# Patient Record
Sex: Male | Born: 1960 | Race: White | Hispanic: No | Marital: Married | Smoking: Never smoker
Health system: Southern US, Community
[De-identification: ages and names within clinical notes are randomized; demographics above are authoritative.]

## PROBLEM LIST (undated history)

## (undated) DIAGNOSIS — R7989 Other specified abnormal findings of blood chemistry: Secondary | ICD-10-CM

## (undated) DIAGNOSIS — E785 Hyperlipidemia, unspecified: Secondary | ICD-10-CM

## (undated) DIAGNOSIS — M549 Dorsalgia, unspecified: Secondary | ICD-10-CM

## (undated) DIAGNOSIS — R790 Abnormal level of blood mineral: Secondary | ICD-10-CM

## (undated) DIAGNOSIS — S42301A Unspecified fracture of shaft of humerus, right arm, initial encounter for closed fracture: Secondary | ICD-10-CM

## (undated) DIAGNOSIS — I10 Essential (primary) hypertension: Secondary | ICD-10-CM

## (undated) DIAGNOSIS — Z8489 Family history of other specified conditions: Secondary | ICD-10-CM

## (undated) DIAGNOSIS — J309 Allergic rhinitis, unspecified: Secondary | ICD-10-CM

## (undated) DIAGNOSIS — R300 Dysuria: Secondary | ICD-10-CM

## (undated) DIAGNOSIS — K2980 Duodenitis without bleeding: Secondary | ICD-10-CM

## (undated) DIAGNOSIS — K2970 Gastritis, unspecified, without bleeding: Secondary | ICD-10-CM

## (undated) DIAGNOSIS — N200 Calculus of kidney: Secondary | ICD-10-CM

## (undated) DIAGNOSIS — M1990 Unspecified osteoarthritis, unspecified site: Secondary | ICD-10-CM

## (undated) HISTORY — DX: Dorsalgia, unspecified: M54.9

## (undated) HISTORY — DX: Dysuria: R30.0

## (undated) HISTORY — DX: Duodenitis without bleeding: K29.80

## (undated) HISTORY — DX: Hypercalcemia: E83.52

## (undated) HISTORY — DX: Other specified abnormal findings of blood chemistry: R79.89

## (undated) HISTORY — DX: Unspecified osteoarthritis, unspecified site: M19.90

## (undated) HISTORY — DX: Unspecified fracture of shaft of humerus, right arm, initial encounter for closed fracture: S42.301A

## (undated) HISTORY — DX: Allergic rhinitis, unspecified: J30.9

## (undated) HISTORY — DX: Gastritis, unspecified, without bleeding: K29.70

## (undated) HISTORY — DX: Calculus of kidney: N20.0

## (undated) HISTORY — DX: Hyperlipidemia, unspecified: E78.5

## (undated) HISTORY — PX: NASAL SINUS SURGERY: SHX719

## (undated) HISTORY — DX: Abnormal level of blood mineral: R79.0

## (undated) HISTORY — PX: TONSILLECTOMY: SUR1361

---

## 1998-10-10 HISTORY — PX: ESOPHAGOGASTRODUODENOSCOPY: SHX1529

## 2002-02-16 HISTORY — PX: FLEXIBLE SIGMOIDOSCOPY: SHX1649

## 2004-03-12 ENCOUNTER — Other Ambulatory Visit: Payer: Self-pay

## 2005-06-15 ENCOUNTER — Emergency Department: Payer: Self-pay | Admitting: Internal Medicine

## 2008-07-07 ENCOUNTER — Ambulatory Visit: Payer: Self-pay | Admitting: Family Medicine

## 2008-09-17 ENCOUNTER — Ambulatory Visit: Payer: Self-pay | Admitting: Internal Medicine

## 2008-10-14 ENCOUNTER — Ambulatory Visit: Payer: Self-pay | Admitting: Internal Medicine

## 2009-02-28 ENCOUNTER — Ambulatory Visit: Payer: Self-pay | Admitting: Gastroenterology

## 2009-02-28 HISTORY — PX: COLONOSCOPY W/ POLYPECTOMY: SHX1380

## 2009-04-11 ENCOUNTER — Ambulatory Visit: Payer: Self-pay | Admitting: Gastroenterology

## 2009-04-17 ENCOUNTER — Ambulatory Visit: Payer: Self-pay | Admitting: Surgery

## 2009-04-17 HISTORY — PX: HEMORRHOID SURGERY: SHX153

## 2010-08-14 ENCOUNTER — Ambulatory Visit: Payer: Self-pay | Admitting: Internal Medicine

## 2014-04-11 ENCOUNTER — Ambulatory Visit: Payer: Self-pay | Admitting: Otolaryngology

## 2014-08-29 DIAGNOSIS — R7989 Other specified abnormal findings of blood chemistry: Secondary | ICD-10-CM | POA: Insufficient documentation

## 2015-02-24 ENCOUNTER — Ambulatory Visit: Payer: Self-pay | Admitting: Internal Medicine

## 2015-02-27 ENCOUNTER — Ambulatory Visit: Payer: Self-pay | Admitting: Urology

## 2015-03-07 ENCOUNTER — Ambulatory Visit: Payer: Self-pay

## 2015-03-27 ENCOUNTER — Ambulatory Visit
Admit: 2015-03-27 | Disposition: A | Discharge status: Home / Self Care | Payer: Self-pay | Attending: Obstetrics and Gynecology | Admitting: Obstetrics and Gynecology

## 2015-08-06 ENCOUNTER — Other Ambulatory Visit: Payer: Self-pay

## 2015-08-06 DIAGNOSIS — N200 Calculus of kidney: Secondary | ICD-10-CM

## 2016-02-27 ENCOUNTER — Encounter: Payer: Self-pay | Admitting: *Deleted

## 2016-03-12 ENCOUNTER — Encounter: Payer: Self-pay | Admitting: Urology

## 2016-03-12 ENCOUNTER — Ambulatory Visit
Admission: RE | Admit: 2016-03-12 | Discharge: 2016-03-12 | Disposition: A | Discharge status: Home / Self Care | Payer: Self-pay | Source: Ambulatory Visit | Attending: Urology | Admitting: Urology

## 2016-03-12 ENCOUNTER — Ambulatory Visit (INDEPENDENT_AMBULATORY_CARE_PROVIDER_SITE_OTHER): Payer: Federal, State, Local not specified - PPO | Admitting: Urology

## 2016-03-12 VITALS — BP 151/82 | HR 59 | Ht 69.0 in | Wt 191.6 lb

## 2016-03-12 DIAGNOSIS — N401 Enlarged prostate with lower urinary tract symptoms: Secondary | ICD-10-CM | POA: Diagnosis not present

## 2016-03-12 DIAGNOSIS — Z87442 Personal history of urinary calculi: Secondary | ICD-10-CM | POA: Diagnosis not present

## 2016-03-12 DIAGNOSIS — N138 Other obstructive and reflux uropathy: Secondary | ICD-10-CM

## 2016-03-12 DIAGNOSIS — N200 Calculus of kidney: Secondary | ICD-10-CM | POA: Insufficient documentation

## 2016-03-12 NOTE — Progress Notes (Signed)
03/12/2016 12:13 PM   Anthony Williamson November 01, 1961 [id number]  Referring provider: No referring provider defined for this encounter.  Chief Complaint  Patient presents with  . Follow-up    1 yr   . Nephrolithiasis    HPI: Patient is a 55 year old Caucasian male with a history of nephrolithiasis and BPH with LUTS who presents today for 1 year follow-up.  History of nephrolithiasis Patient underwent right ESWL for a 1.3 cm right renal stone on 02/27/2015.   Stone composition was found to be calcium slight monohydrate 45%, calcium oxalate dihydrate 35% and calcium phosphate at 5%.  Patient did not want to pursue a 24-hour urine analysis.  He did state that last Wednesday he had pain across his mid back. The pain was worse on the left side versus the right. He went to urinate and had a stall, felt pressure and then started to urinate freely again.  He feels he may have passed a stone during this time.  He did not pass a distinct fragment and he did not experience gross hematuria.  UA today was negative for microscopic hematuria.  KUB taken this morning did not demonstrate any nephrolithiasis.  BPH WITH LUTS His IPSS score today is 11, which is moderate lower urinary tract symptomatology. He is pleased with his quality life due to his urinary symptoms.  Does complain of nocturia, but he attributes this to being awoken by dreams versus the need to urinate.   He denies any dysuria, hematuria or suprapubic pain.   He also denies any recent fevers, chills, nausea or vomiting.  He has a family history of PCa, with father being diagnosed with prostate cancer.        IPSS      03/12/16 1100       International Prostate Symptom Score   How often have you had the sensation of not emptying your bladder? Less than 1 in 5     How often have you had to urinate less than every two hours? About half the time     How often have you found you stopped and started again several times when you urinated?  Less than 1 in 5 times     How often have you found it difficult to postpone urination? Not at All     How often have you had a weak urinary stream? Less than 1 in 5 times     How often have you had to strain to start urination? Not at All     How many times did you typically get up at night to urinate? 5 Times     Total IPSS Score 11     Quality of Life due to urinary symptoms   If you were to spend the rest of your life with your urinary condition just the way it is now how would you feel about that? Pleased        Score:  1-7 Mild 8-19 Moderate 20-35 Severe     PMH: Past Medical History  Diagnosis Date  . Right humeral fracture   . Hypercalcemia   . Right kidney stone   . Gastritis   . Duodenitis   . Inflammatory arthritis ([place])   . Low ferritin   . Dysuria   . Mechanical back pain   . Low testosterone   . HLD (hyperlipidemia)   . Allergic rhinitis     Surgical History: Past Surgical History  Procedure Laterality Date  . Tonsillectomy    .  Nasal sinus surgery      Home Medications:    Medication List       This list is accurate as of: 03/12/16 12:13 PM.  Always use your most recent med list.               cholecalciferol 1000 units tablet  Commonly known as:  VITAMIN D  Take 1,000 Units by mouth daily.     HYDROcodone-acetaminophen 10-325 MG tablet  Commonly known as:  NORCO  Take by mouth.        Allergies:  Allergies  Allergen Reactions  . Ranitidine Hcl Other (See Comments)    Family History: Family History  Problem Relation Age of Onset  . Coronary artery disease    . Prostate cancer Father   . Kidney disease Neg Hx   . Kidney cancer Neg Hx     Social History:  reports that he has never smoked. He does not have any smokeless tobacco history on file. He reports that he does not drink alcohol. His drug history is not on file.  ROS: UROLOGY Frequent Urination?: No Hard to postpone urination?: No Burning/pain with urination?:  No Get up at night to urinate?: No Leakage of urine?: No Urine stream starts and stops?: No Trouble starting stream?: No Do you have to strain to urinate?: No Blood in urine?: No Urinary tract infection?: No Sexually transmitted disease?: No Injury to kidneys or bladder?: No Painful intercourse?: No Weak stream?: No Erection problems?: No Penile pain?: No  Gastrointestinal Nausea?: No Vomiting?: No Indigestion/heartburn?: No Diarrhea?: No Constipation?: No  Constitutional Fever: No Night sweats?: No Weight loss?: No Fatigue?: No  Skin Skin rash/lesions?: No Itching?: No  Eyes Blurred vision?: No Double vision?: No  Ears/Nose/Throat Sore throat?: No Sinus problems?: No  Hematologic/Lymphatic Swollen glands?: No Easy bruising?: No  Cardiovascular Leg swelling?: No Chest pain?: No  Respiratory Cough?: No Shortness of breath?: No  Endocrine Excessive thirst?: No  Musculoskeletal Back pain?: Yes Joint pain?: No  Neurological Headaches?: No Dizziness?: No  Psychologic Depression?: No Anxiety?: No  Physical Exam: BP 151/82 mmHg  Pulse 59  Ht 5\' 9"  (1.753 m)  Wt 191 lb 9.6 oz (86.909 kg)  BMI 28.28 kg/m2  Constitutional: Well nourished. Alert and oriented, No acute distress. HEENT: [place] AT, moist mucus membranes. Trachea midline, no masses. Cardiovascular: No clubbing, cyanosis, or edema. Respiratory: Normal respiratory effort, no increased work of breathing. GI: Abdomen is soft, non tender, non distended, no abdominal masses. Liver and spleen not palpable.  No hernias appreciated.  Stool sample for occult testing is not indicated.   GU: No CVA tenderness.  No bladder fullness or masses.  Patient with circumcised phallus.  Urethral meatus is patent.  No penile discharge. No penile lesions or rashes. Scrotum without lesions, cysts, rashes and/or edema.  Testicles are located scrotally bilaterally. No masses are appreciated in the testicles. Left  and right epididymis are normal. Rectal: Patient with  normal sphincter tone. Anus and perineum without scarring or rashes. No rectal masses are appreciated. Prostate is approximately 40 grams, no nodules are appreciated. Seminal vesicles are normal. Skin: No rashes, bruises or suspicious lesions. Lymph: No cervical or inguinal adenopathy. Neurologic: Grossly intact, no focal deficits, moving all 4 extremities. Psychiatric: Normal mood and affect.  Laboratory Data: PSA history  0.78 ng/mL on 11/29/2014  0.67 ng/mL on 06/13/2015  Urinalysis  Results for orders placed or performed in visit on 03/12/16  Microscopic Examination  Result Value Ref  Range   WBC, UA 0-5 0 -  5 /hpf   RBC, UA None seen 0 -  2 /hpf   Epithelial Cells (non renal) None seen 0 - 10 /hpf   Crystals Present (A) N/A   Crystal Type Calcium Oxalate N/A   Bacteria, UA None seen None seen/Few  Urinalysis, Complete  Result Value Ref Range   Specific Gravity, UA 1.025 1.005 - 1.030   pH, UA 5.5 5.0 - 7.5   Color, UA Yellow Yellow   Appearance Ur Clear Clear   Leukocytes, UA Negative Negative   Protein, UA Negative Negative/Trace   Glucose, UA Negative Negative   Ketones, UA Negative Negative   RBC, UA Negative Negative   Bilirubin, UA Negative Negative   Urobilinogen, Ur 0.2 0.2 - 1.0 mg/dL   Nitrite, UA Negative Negative   Microscopic Examination See below:     Pertinent Imaging: CLINICAL DATA: Nephrolithiasis.  EXAM: ABDOMEN - 1 VIEW  COMPARISON: March 27, 2015.  FINDINGS: The bowel gas pattern is normal. No renal or ureteral calculi are noted. Stable small phlebolith seen in left side of pelvis.  IMPRESSION: No evidence of bowel obstruction or ileus. No evidence of nephrolithiasis.   Electronically Signed  By: Marijo Conception, M.D.  On: 03/12/2016 09:11  Assessment & Plan:    1. History of nephrolithiasis:    Patient may have passed stone one week ago.   KUB taken today does not  demonstrate any nephrolithiasis.   He is not interested at this time in pursuing a 24-hour urine analysis.   He will contact our office if he experiences gross hematuria or flank pain.  Otherwise, we will see him in one year with a KUB, UA and symptom recheck.   2. BPH with LUTS:   IPSS score is 11/1.   We will continue to monitor.  He will have his IPSS score, exam and PSA in 12 months.  - Urinalysis, Complete - PSA   Return in about 1 year (around 03/12/2017) for IPSS score, KUB, UA, PSA and exam.  These notes generated with voice recognition software. I apologize for typographical errors.  Zara Council, [facility] [address], [place], [place] [address] [phone]

## 2016-03-13 LAB — URINALYSIS, COMPLETE
Bilirubin, UA: NEGATIVE
Glucose, UA: NEGATIVE
Ketones, UA: NEGATIVE
LEUKOCYTES UA: NEGATIVE
Nitrite, UA: NEGATIVE
PH UA: 5.5 (ref 5.0–7.5)
Protein, UA: NEGATIVE
RBC, UA: NEGATIVE
Specific Gravity, UA: 1.025 (ref 1.005–1.030)
Urobilinogen, Ur: 0.2 mg/dL (ref 0.2–1.0)

## 2016-03-13 LAB — MICROSCOPIC EXAMINATION
BACTERIA UA: NONE SEEN
EPITHELIAL CELLS (NON RENAL): NONE SEEN /HPF (ref 0–10)
RBC MICROSCOPIC, UA: NONE SEEN /HPF (ref 0–?)

## 2016-03-13 LAB — PSA: Prostate Specific Ag, Serum: 0.7 ng/mL (ref 0.0–4.0)

## 2016-03-15 ENCOUNTER — Telehealth: Payer: Self-pay

## 2016-03-15 NOTE — Telephone Encounter (Signed)
-----   Message from Nori Riis, PA-C sent at 03/14/2016  9:02 PM EDT ----- PSA is stable.  We will see him next year.

## 2016-03-15 NOTE — Telephone Encounter (Signed)
Patient notified/SW 

## 2016-03-17 ENCOUNTER — Encounter: Payer: Self-pay | Admitting: Urology

## 2016-03-17 DIAGNOSIS — N401 Enlarged prostate with lower urinary tract symptoms: Secondary | ICD-10-CM

## 2016-03-17 DIAGNOSIS — Z87442 Personal history of urinary calculi: Secondary | ICD-10-CM | POA: Insufficient documentation

## 2016-03-17 DIAGNOSIS — N138 Other obstructive and reflux uropathy: Secondary | ICD-10-CM | POA: Insufficient documentation

## 2016-03-26 ENCOUNTER — Ambulatory Visit: Payer: Self-pay | Admitting: Urology

## 2016-04-15 ENCOUNTER — Encounter: Payer: Self-pay | Admitting: *Deleted

## 2016-04-21 ENCOUNTER — Encounter: Payer: Self-pay | Admitting: *Deleted

## 2016-04-21 ENCOUNTER — Ambulatory Visit: Payer: Federal, State, Local not specified - PPO | Admitting: Anesthesiology

## 2016-04-21 ENCOUNTER — Ambulatory Visit
Admission: RE | Admit: 2016-04-21 | Discharge: 2016-04-21 | Disposition: A | Discharge status: Home / Self Care | Payer: Federal, State, Local not specified - PPO | Source: Ambulatory Visit | Attending: Surgery | Admitting: Surgery

## 2016-04-21 ENCOUNTER — Encounter
Admission: RE | Disposition: A | Discharge status: Home / Self Care | Payer: Self-pay | Source: Ambulatory Visit | Attending: Surgery

## 2016-04-21 DIAGNOSIS — Z881 Allergy status to other antibiotic agents status: Secondary | ICD-10-CM | POA: Diagnosis not present

## 2016-04-21 DIAGNOSIS — M75101 Unspecified rotator cuff tear or rupture of right shoulder, not specified as traumatic: Secondary | ICD-10-CM | POA: Diagnosis present

## 2016-04-21 DIAGNOSIS — Z8249 Family history of ischemic heart disease and other diseases of the circulatory system: Secondary | ICD-10-CM | POA: Diagnosis not present

## 2016-04-21 DIAGNOSIS — Z888 Allergy status to other drugs, medicaments and biological substances status: Secondary | ICD-10-CM | POA: Insufficient documentation

## 2016-04-21 DIAGNOSIS — E785 Hyperlipidemia, unspecified: Secondary | ICD-10-CM | POA: Insufficient documentation

## 2016-04-21 DIAGNOSIS — M064 Inflammatory polyarthropathy: Secondary | ICD-10-CM | POA: Diagnosis not present

## 2016-04-21 DIAGNOSIS — Z823 Family history of stroke: Secondary | ICD-10-CM | POA: Diagnosis not present

## 2016-04-21 DIAGNOSIS — Z886 Allergy status to analgesic agent status: Secondary | ICD-10-CM | POA: Insufficient documentation

## 2016-04-21 DIAGNOSIS — M75111 Incomplete rotator cuff tear or rupture of right shoulder, not specified as traumatic: Secondary | ICD-10-CM | POA: Diagnosis not present

## 2016-04-21 DIAGNOSIS — Z79899 Other long term (current) drug therapy: Secondary | ICD-10-CM | POA: Insufficient documentation

## 2016-04-21 DIAGNOSIS — Z79891 Long term (current) use of opiate analgesic: Secondary | ICD-10-CM | POA: Diagnosis not present

## 2016-04-21 DIAGNOSIS — Z818 Family history of other mental and behavioral disorders: Secondary | ICD-10-CM | POA: Insufficient documentation

## 2016-04-21 DIAGNOSIS — Z9889 Other specified postprocedural states: Secondary | ICD-10-CM | POA: Insufficient documentation

## 2016-04-21 HISTORY — PX: SHOULDER ARTHROSCOPY: SHX128

## 2016-04-21 HISTORY — DX: Family history of other specified conditions: Z84.89

## 2016-04-21 SURGERY — ARTHROSCOPY, SHOULDER
Anesthesia: Monitor Anesthesia Care | Laterality: Right | Wound class: Clean

## 2016-04-21 MED ORDER — ONDANSETRON HCL 4 MG/2ML IJ SOLN
4.0000 mg | Freq: Once | INTRAMUSCULAR | Status: DC | PRN
Start: 2016-04-21 — End: 2016-04-21

## 2016-04-21 MED ORDER — OXYCODONE HCL 5 MG/5ML PO SOLN: 5.0000 mg | Freq: Once | ORAL | Status: DC | PRN

## 2016-04-21 MED ORDER — ONDANSETRON 4 MG PO TBDP
4.0000 mg | ORAL_TABLET | Freq: Once | ORAL | Status: AC
  Administered 2016-04-21: 4 mg via ORAL

## 2016-04-21 MED ORDER — HYDROCODONE-ACETAMINOPHEN 10-325 MG PO TABS: 1.0000 | ORAL_TABLET | ORAL | Status: DC | PRN

## 2016-04-21 MED ORDER — POTASSIUM CHLORIDE IN NACL 20-0.9 MEQ/L-% IV SOLN: INTRAVENOUS | Status: DC

## 2016-04-21 MED ORDER — MIDAZOLAM HCL 5 MG/5ML IJ SOLN
INTRAMUSCULAR | Status: DC | PRN
  Administered 2016-04-21: 2 mg via INTRAVENOUS

## 2016-04-21 MED ORDER — GLYCOPYRROLATE 0.2 MG/ML IJ SOLN
INTRAMUSCULAR | Status: DC | PRN
  Administered 2016-04-21: 0.1 mg via INTRAVENOUS

## 2016-04-21 MED ORDER — ROPIVACAINE HCL 5 MG/ML IJ SOLN
INTRAMUSCULAR | Status: DC | PRN
  Administered 2016-04-21: 30 mL via PERINEURAL

## 2016-04-21 MED ORDER — METOCLOPRAMIDE HCL 5 MG/ML IJ SOLN
5.0000 mg | Freq: Three times a day (TID) | INTRAMUSCULAR | Status: DC | PRN
Start: 2016-04-21 — End: 2016-04-21

## 2016-04-21 MED ORDER — ONDANSETRON HCL 4 MG/2ML IJ SOLN: 4.0000 mg | Freq: Four times a day (QID) | INTRAMUSCULAR | Status: DC | PRN

## 2016-04-21 MED ORDER — METOCLOPRAMIDE HCL 5 MG PO TABS
5.0000 mg | ORAL_TABLET | Freq: Three times a day (TID) | ORAL | Status: DC | PRN
Start: 2016-04-21 — End: 2016-04-21

## 2016-04-21 MED ORDER — PROPOFOL 10 MG/ML IV BOLUS
INTRAVENOUS | Status: DC | PRN
  Administered 2016-04-21: 200 mg via INTRAVENOUS

## 2016-04-21 MED ORDER — ONDANSETRON HCL 4 MG/2ML IJ SOLN
INTRAMUSCULAR | Status: DC | PRN
  Administered 2016-04-21: 4 mg via INTRAVENOUS

## 2016-04-21 MED ORDER — LACTATED RINGERS IV SOLN: 500.0000 mL | INTRAVENOUS | Status: DC

## 2016-04-21 MED ORDER — DEXAMETHASONE SODIUM PHOSPHATE 4 MG/ML IJ SOLN
INTRAMUSCULAR | Status: DC | PRN
  Administered 2016-04-21: 4 mg via PERINEURAL
  Administered 2016-04-21: 4 mg via INTRAVENOUS

## 2016-04-21 MED ORDER — LIDOCAINE HCL (CARDIAC) 20 MG/ML IV SOLN
INTRAVENOUS | Status: DC | PRN
  Administered 2016-04-21: 40 mg via INTRATRACHEAL

## 2016-04-21 MED ORDER — CEFAZOLIN SODIUM-DEXTROSE 2-4 GM/100ML-% IV SOLN
2.0000 g | Freq: Once | INTRAVENOUS | Status: AC
  Administered 2016-04-21: 2 g via INTRAVENOUS

## 2016-04-21 MED ORDER — OXYCODONE HCL 5 MG PO TABS: 5.0000 mg | ORAL_TABLET | Freq: Once | ORAL | Status: DC | PRN

## 2016-04-21 MED ORDER — LACTATED RINGERS IV SOLN
INTRAVENOUS | Status: DC
Start: 2016-04-21 — End: 2016-04-21
  Administered 2016-04-21: 12:00:00 via INTRAVENOUS

## 2016-04-21 MED ORDER — HYDROMORPHONE HCL 1 MG/ML IJ SOLN: 0.2500 mg | INTRAMUSCULAR | Status: DC | PRN

## 2016-04-21 MED ORDER — BUPIVACAINE-EPINEPHRINE (PF) 0.5% -1:200000 IJ SOLN
INTRAMUSCULAR | Status: DC | PRN
  Administered 2016-04-21: 30 mL

## 2016-04-21 MED ORDER — ONDANSETRON HCL 4 MG PO TABS: 4.0000 mg | ORAL_TABLET | Freq: Four times a day (QID) | ORAL | Status: DC | PRN

## 2016-04-21 MED ORDER — FENTANYL CITRATE (PF) 100 MCG/2ML IJ SOLN
INTRAMUSCULAR | Status: DC | PRN
  Administered 2016-04-21: 100 ug via INTRAVENOUS

## 2016-04-21 SURGICAL SUPPLY — 36 items
BIT DRILL JUGRKNT W/NDL BIT2.9 (DRILL) IMPLANT
BLADE FULL RADIUS 3.5 (BLADE) ×3 IMPLANT
BUR ACROMIONIZER 4.0 (BURR) ×3 IMPLANT
CANNULA SHAVER 8MMX76MM (CANNULA) ×3 IMPLANT
CHLORAPREP W/TINT 26ML (MISCELLANEOUS) ×6 IMPLANT
COVER LIGHT HANDLE UNIVERSAL (MISCELLANEOUS) ×6 IMPLANT
COVER MAYO STAND STRL (DRAPES) ×3 IMPLANT
DRAPE IMP U-DRAPE 54X76 (DRAPES) ×6 IMPLANT
DRILL JUGGERKNOT W/NDL BIT 2.9 (DRILL)
GAUZE PETRO XEROFOAM 1X8 (MISCELLANEOUS) ×3 IMPLANT
GAUZE SPONGE 4X4 12PLY STRL (GAUZE/BANDAGES/DRESSINGS) ×3 IMPLANT
GLOVE BIO SURGEON STRL SZ8 (GLOVE) ×6 IMPLANT
GLOVE INDICATOR 8.0 STRL GRN (GLOVE) ×3 IMPLANT
GOWN STRL REUS W/ TWL LRG LVL3 (GOWN DISPOSABLE) ×1 IMPLANT
GOWN STRL REUS W/ TWL XL LVL3 (GOWN DISPOSABLE) ×1 IMPLANT
GOWN STRL REUS W/TWL LRG LVL3 (GOWN DISPOSABLE) ×2
GOWN STRL REUS W/TWL XL LVL3 (GOWN DISPOSABLE) ×2
IV LACTATED RINGER IRRG 3000ML (IV SOLUTION) ×4
IV LR IRRIG 3000ML ARTHROMATIC (IV SOLUTION) ×2 IMPLANT
KIT ROOM TURNOVER OR (KITS) ×3 IMPLANT
MANIFOLD 4PT FOR NEPTUNE1 (MISCELLANEOUS) ×3 IMPLANT
MAT BLUE FLOOR 46X72 FLO (MISCELLANEOUS) ×3 IMPLANT
NEEDLE HYPO 21X1.5 SAFETY (NEEDLE) ×3 IMPLANT
PACK ARTHROSCOPY SHOULDER (MISCELLANEOUS) ×3 IMPLANT
PAD GROUND ADULT SPLIT (MISCELLANEOUS) ×3 IMPLANT
SLING ARM M TX990204 (SOFTGOODS) ×3 IMPLANT
STAPLER SKIN PROX 35W (STAPLE) ×3 IMPLANT
STRAP BODY AND KNEE 60X3 (MISCELLANEOUS) ×6 IMPLANT
SUT ETHIBOND 0 MO6 C/R (SUTURE) ×3 IMPLANT
SUT VIC AB 2-0 CT1 27 (SUTURE) ×4
SUT VIC AB 2-0 CT1 TAPERPNT 27 (SUTURE) ×2 IMPLANT
TAPE MICROFOAM 4IN (TAPE) ×3 IMPLANT
TUBING ARTHRO INFLOW-ONLY STRL (TUBING) ×3 IMPLANT
TUBING CONNECTING 10 (TUBING) ×2 IMPLANT
TUBING CONNECTING 10' (TUBING) ×1
WAND HAND CNTRL MULTIVAC 90 (MISCELLANEOUS) ×3 IMPLANT

## 2016-04-21 NOTE — Progress Notes (Signed)
Assisted Daniel Kovacs ANMD with right, ultrasound guided, interscalene  block. Side rails up, monitors on throughout procedure. See vital signs in flow sheet. Tolerated Procedure well.  

## 2016-04-21 NOTE — Anesthesia Postprocedure Evaluation (Signed)
Anesthesia Post Note  Patient: Anthony Williamson  Procedure(s) Performed: Procedure(s) (LRB): Limited arthroscopic debridement, arthroscopic subacromial decompression, and mini-open rotator cuff exploration, right shoulder. (Right)  Patient location during evaluation: PACU Anesthesia Type: General and Regional Level of consciousness: awake and alert Pain management: pain level controlled Vital Signs Assessment: post-procedure vital signs reviewed and stable Respiratory status: spontaneous breathing, nonlabored ventilation and respiratory function stable Cardiovascular status: blood pressure returned to baseline and stable Postop Assessment: no signs of nausea or vomiting Anesthetic complications: no    DANIEL D KOVACS

## 2016-04-21 NOTE — Discharge Instructions (Addendum)
General Anesthesia, Adult, Care After Refer to this sheet in the next few weeks. These instructions provide you with information on caring for yourself after your procedure. Your health care provider may also give you more specific instructions. Your treatment has been planned according to current medical practices, but problems sometimes occur. Call your health care provider if you have any problems or questions after your procedure. WHAT TO EXPECT AFTER THE PROCEDURE After the procedure, it is typical to experience:  Sleepiness.  Nausea and vomiting. HOME CARE INSTRUCTIONS  For the first 24 hours after general anesthesia:  Have a responsible person with you.  Do not drive a car. If you are alone, do not take public transportation.  Do not drink alcohol.  Do not take medicine that has not been prescribed by your health care provider.  Do not sign important papers or make important decisions.  You may resume a normal diet and activities as directed by your health care provider.  Change bandages (dressings) as directed.  If you have questions or problems that seem related to general anesthesia, call the hospital and ask for the anesthetist or anesthesiologist on call. SEEK MEDICAL CARE IF:  You have nausea and vomiting that continue the day after anesthesia.  You develop a rash. SEEK IMMEDIATE MEDICAL CARE IF:   You have difficulty breathing.  You have chest pain.  You have any allergic problems.   This information is not intended to replace advice given to you by your health care provider. Make sure you discuss any questions you have with your health care provider.   Document Released: 03/21/2001 Document Revised: 01/03/2015 Document Reviewed: 04/12/2012 Elsevier Interactive Patient Education 2016 [employer].  Keep dressing dry and intact.  May shower after dressing changed on post-op day #4 (Sunday).  Cover staples with Band-Aids after drying off. Apply ice  frequently to shoulder. Use sling at all times for first 3-4 days after surgery, then may remove it, using it only as necessary for comfort. Follow-up in 10-14 days or as scheduled.

## 2016-04-21 NOTE — Anesthesia Preprocedure Evaluation (Signed)
Anesthesia Evaluation  Patient identified by MRN, date of birth, ID band Patient awake    Reviewed: Allergy & Precautions, H&P , NPO status , Patient's Chart, lab work & pertinent test results, reviewed documented beta blocker date and time   Airway Mallampati: II  TM Distance: >3 FB Neck ROM: full    Dental no notable dental hx.    Pulmonary neg pulmonary ROS,    Pulmonary exam normal breath sounds clear to auscultation       Cardiovascular Exercise Tolerance: Good negative cardio ROS   Rhythm:regular Rate:Normal     Neuro/Psych negative neurological ROS  negative psych ROS   GI/Hepatic negative GI ROS, Neg liver ROS,   Endo/Other  negative endocrine ROS  Renal/GU Renal diseaseH/o kidney stones  negative genitourinary   Musculoskeletal   Abdominal   Peds  Hematology negative hematology ROS (+)   Anesthesia Other Findings   Reproductive/Obstetrics negative OB ROS                             Anesthesia Physical Anesthesia Plan  ASA: II  Anesthesia Plan: MAC and General LMA   Post-op Pain Management:    Induction:   Airway Management Planned:   Additional Equipment:   Intra-op Plan:   Post-operative Plan:   Informed Consent: I have reviewed the patients History and Physical, chart, labs and discussed the procedure including the risks, benefits and alternatives for the proposed anesthesia with the patient or authorized representative who has indicated his/her understanding and acceptance.     Plan Discussed with: CRNA  Anesthesia Plan Comments:         Anesthesia Quick Evaluation

## 2016-04-21 NOTE — Transfer of Care (Signed)
Immediate Anesthesia Transfer of Care Note  Patient: Anthony Williamson  Procedure(s) Performed: Procedure(s): ARTHROSCOPY SHOULDER WITH DEBRIDEMENT PROBABLE LABRAL REPAIR DECOMPRESSION AND POSSIBLE BICEPS TENODESIS (Right)  Patient Location: PACU  Anesthesia Type: MAC, General LMA  Level of Consciousness: awake, alert  and patient cooperative  Airway and Oxygen Therapy: Patient Spontanous Breathing and Patient connected to supplemental oxygen  Post-op Assessment: Post-op Vital signs reviewed, Patient's Cardiovascular Status Stable, Respiratory Function Stable, Patent Airway and No signs of Nausea or vomiting  Post-op Vital Signs: Reviewed and stable  Complications: No apparent anesthesia complications

## 2016-04-21 NOTE — H&P (Signed)
Paper H&P to be scanned into permanent record. H&P reviewed. No changes. 

## 2016-04-21 NOTE — Anesthesia Procedure Notes (Addendum)
Anesthesia Regional Block:  Interscalene brachial plexus block  Pre-Anesthetic Checklist: ,, timeout performed, Correct Patient, Correct Site, Correct Laterality, Correct Procedure, Correct Position, site marked, Risks and benefits discussed,  Surgical consent,  Pre-op evaluation,  At surgeon's request and post-op pain management  Laterality: Right  Prep: chloraprep       Needles:  Injection technique: Single-shot  Needle Type: Stimiplex     Needle Length: 10cm 10 cm Needle Gauge: 21 and 21 G    Additional Needles:  Procedures: ultrasound guided (picture in chart) Interscalene brachial plexus block Narrative:  Start time: 04/21/2016 12:18 PM End time: 04/21/2016 12:24 PM Injection made incrementally with aspirations every 5 mL.  Performed by: Personally  Anesthesiologist: Marchia Bond D  Additional Notes: Functioning IV was confirmed and monitors applied. Ultrasound guidance: relevant anatomy identified, needle position confirmed, local anesthetic spread visualized around nerve(s)., vascular puncture avoided.  Image printed for medical record.  Negative aspiration and no paresthesias; incremental administration of local anesthetic. The patient tolerated the procedure well. Vitals signes recorded in RN notes.   Procedure Name: LMA Insertion Date/Time: 04/21/2016 1:09 PM Performed by: Mayme Genta Pre-anesthesia Checklist: Patient identified, Emergency Drugs available, Suction available, Timeout performed and Patient being monitored Patient Re-evaluated:Patient Re-evaluated prior to inductionOxygen Delivery Method: Circle system utilized Preoxygenation: Pre-oxygenation with 100% oxygen Intubation Type: IV induction LMA: LMA inserted LMA Size: 4.0 Number of attempts: 1 Placement Confirmation: positive ETCO2 and breath sounds checked- equal and bilateral Tube secured with: Tape

## 2016-04-21 NOTE — Op Note (Signed)
04/21/2016  2:20 PM  Patient:   Anthony Williamson  Pre-Op Diagnosis:   Impingement/tendinopathy with possible SLAP tear, right shoulder.  Postoperative diagnosis: Impingement/tendinopathy with labral fraying, right shoulder.  Procedure: Limited arthroscopic debridement, arthroscopic subacromial decompression, and mini-open rotator cuff exploration, right shoulder.  Anesthesia: General LMA with interscalene block placed preoperatively by the anesthesiologist.  Surgeon:   Pascal Lux, MD  Assistant:   None  Findings: As above. The rotator cuff was in excellent condition other than some very superficial abrasion of the anterior bursal surface insertional fibers of the supraspinatus. There was some fraying of the labrum anteriorly and superiorly without frank detachment. The biceps tendon itself was in excellent condition, as were the articular surfaces of the glenoid and humerus.  Complications: None  Fluids:   700 cc  Estimated blood loss: 10 cc  Tourniquet time: None  Drains: None  Closure: Staples   Brief clinical note: The patient is a 55 year old male with a history of gradually worsening right shoulder pain. The patient's symptoms have progressed despite medications, activity modification, etc. The patient's history and examination are consistent with impingement/tendinopathy with a possible labral tear. An MRI scan was inconclusive for labral or rotator cuff tears. The patient presents at this time for definitive management of these shoulder symptoms.  Procedure: The patient underwent placement of an interscalene block by the anesthesiologist in the preoperative holding area before he was brought into the operating room and lain in the supine position. The patient then underwent general endotracheal laryngeal mask anesthesia before being repositioned in the beach chair position using the beach chair positioner. The right shoulder and upper extremity  were prepped with ChloraPrep solution before being draped sterilely. Preoperative antibiotics were administered. A timeout was performed to confirm the proper surgical site before the expected portal sites and incision site were injected with 0.5% Sensorcaine with epinephrine. A posterior portal was created and the glenohumeral joint thoroughly inspected with the findings as described above. An anterior portal was created using an outside-in technique. The labrum and rotator cuff were further probed, again confirming the above-noted findings. The areas of labral fraying and synovitis were debrided back to stable margins using the full-radius resector. The biceps tendon was hooked and pulled into the joint to assess of more thoroughly with the findings as described above. The ArthroCare wand was inserted and used to obtain hemostasis as well as to "anneal" the labrum superiorly and anteriorly. The instruments were removed from the joint after suctioning the excess fluid.  The camera was repositioned through the posterior portal into the subacromial space. A separate lateral portal was created using an outside-in technique. The 3.5 mm full-radius resector was introduced and used to perform a subtotal bursectomy. The ArthroCare wand was then inserted and used to remove the periosteal tissue off the undersurface of the anterior third of the acromion as well as to recess the coracoacromial ligament from its attachment along the anterior and lateral margins of the acromion. The 4.0 mm acromionizing bur was introduced and used to complete the decompression by removing the undersurface of the anterior third of the acromion. The full radius resector was reintroduced to remove any residual bony debris before the ArthroCare wand was reintroduced to obtain hemostasis. The rotator cuff was carefully inspected. There appeared to be a bursal surface partial thickness tear of the anterior insertional fibers of the supraspinatus,  so it was felt best to proceed with a mini-open exploration of the tendon with repair if indicated. The  instruments were then removed from the subacromial space after suctioning the excess fluid.  An approximately 3-4 cm incision was made over the anterolateral aspect of the shoulder beginning at the anterolateral corner of the acromion and extending distally in line with the bicipital groove. This incision was carried down through the subcutaneous tissues to expose the deltoid fascia. The raphae between the anterior and middle thirds was identified and this plane developed to provide access into the subacromial space. Additional bursal tissues were debrided sharply using Metzenbaum scissors. The area of concern on the bursal surface of the rotator cuff tear was readily identified. Once the thickened bursal tissues were removed, there was evidence of mild superficial focal abrasion of the bursal surface of the rotator cuff, but no obvious partial-thickness tearing of the bursal surface of the tendon.  The wound was copiously irrigated with sterile saline solution before the deltoid raphae was reapproximated using 2-0 Vicryl interrupted sutures. The subcutaneous tissues were closed in two layers using 2-0 Vicryl interrupted sutures before the skin was closed using staples. The portal sites also were closed using staples. A sterile bulky dressing was applied to the shoulder before the arm was placed into a shoulder sling. The patient was then awakened, extubated, and returned to the recovery room in satisfactory condition after tolerating the procedure well.

## 2016-04-22 ENCOUNTER — Encounter: Payer: Self-pay | Admitting: Surgery

## 2016-04-22 DIAGNOSIS — M7551 Bursitis of right shoulder: Secondary | ICD-10-CM | POA: Insufficient documentation

## 2017-01-04 ENCOUNTER — Telehealth: Payer: Self-pay | Admitting: Urology

## 2017-01-04 DIAGNOSIS — N200 Calculus of kidney: Secondary | ICD-10-CM

## 2017-01-04 NOTE — Telephone Encounter (Signed)
Orders placed.

## 2017-01-04 NOTE — Telephone Encounter (Signed)
Patient has a follow up in March and needs a KUB prior but there in not an order. Can you put one in please?   Thanks,  Sharyn Lull

## 2017-01-05 ENCOUNTER — Other Ambulatory Visit: Payer: Self-pay | Admitting: *Deleted

## 2017-01-05 DIAGNOSIS — Z87442 Personal history of urinary calculi: Secondary | ICD-10-CM

## 2017-01-05 DIAGNOSIS — N401 Enlarged prostate with lower urinary tract symptoms: Secondary | ICD-10-CM

## 2017-01-21 DIAGNOSIS — E559 Vitamin D deficiency, unspecified: Secondary | ICD-10-CM | POA: Insufficient documentation

## 2017-03-04 ENCOUNTER — Ambulatory Visit
Admission: RE | Admit: 2017-03-04 | Discharge: 2017-03-04 | Disposition: A | Discharge status: Home / Self Care | Payer: Federal, State, Local not specified - PPO | Source: Ambulatory Visit | Attending: Urology | Admitting: Urology

## 2017-03-04 ENCOUNTER — Other Ambulatory Visit
Admission: RE | Admit: 2017-03-04 | Discharge: 2017-03-04 | Disposition: A | Discharge status: Home / Self Care | Payer: Federal, State, Local not specified - PPO | Source: Ambulatory Visit | Attending: Urology | Admitting: Urology

## 2017-03-04 ENCOUNTER — Other Ambulatory Visit: Payer: Federal, State, Local not specified - PPO

## 2017-03-04 DIAGNOSIS — N200 Calculus of kidney: Secondary | ICD-10-CM | POA: Diagnosis not present

## 2017-03-04 DIAGNOSIS — N401 Enlarged prostate with lower urinary tract symptoms: Secondary | ICD-10-CM

## 2017-03-04 DIAGNOSIS — Z87442 Personal history of urinary calculi: Secondary | ICD-10-CM

## 2017-03-04 LAB — URINALYSIS, COMPLETE (UACMP) WITH MICROSCOPIC
Bacteria, UA: NONE SEEN
Bilirubin Urine: NEGATIVE
Glucose, UA: NEGATIVE mg/dL
Hgb urine dipstick: NEGATIVE
KETONES UR: NEGATIVE mg/dL
Leukocytes, UA: NEGATIVE
Nitrite: NEGATIVE
PH: 5.5 (ref 5.0–8.0)
Protein, ur: NEGATIVE mg/dL
RBC / HPF: NONE SEEN RBC/hpf (ref 0–5)
Specific Gravity, Urine: 1.025 (ref 1.005–1.030)
Squamous Epithelial / LPF: NONE SEEN

## 2017-03-04 LAB — PSA: PSA: 0.61 ng/mL (ref 0.00–4.00)

## 2017-03-10 ENCOUNTER — Ambulatory Visit: Payer: Federal, State, Local not specified - PPO | Admitting: Urology

## 2017-03-11 ENCOUNTER — Ambulatory Visit: Payer: Federal, State, Local not specified - PPO | Admitting: Urology

## 2017-03-11 ENCOUNTER — Encounter: Payer: Self-pay | Admitting: Urology

## 2017-03-11 ENCOUNTER — Ambulatory Visit (INDEPENDENT_AMBULATORY_CARE_PROVIDER_SITE_OTHER): Payer: Federal, State, Local not specified - PPO | Admitting: Urology

## 2017-03-11 VITALS — BP 134/81 | HR 54 | Ht 69.0 in | Wt 196.0 lb

## 2017-03-11 DIAGNOSIS — N401 Enlarged prostate with lower urinary tract symptoms: Secondary | ICD-10-CM | POA: Diagnosis not present

## 2017-03-11 DIAGNOSIS — N138 Other obstructive and reflux uropathy: Secondary | ICD-10-CM | POA: Diagnosis not present

## 2017-03-11 DIAGNOSIS — Z87442 Personal history of urinary calculi: Secondary | ICD-10-CM

## 2017-03-11 DIAGNOSIS — N200 Calculus of kidney: Secondary | ICD-10-CM | POA: Diagnosis not present

## 2017-03-11 NOTE — Progress Notes (Signed)
03/11/2017 10:18 AM   Anthony Williamson 09/18/1961 [id number]  Referring provider: Idelle Crouch, MD [place] [facility] [place], [place] [address]  Chief Complaint  Patient presents with  . Benign Prostatic Hypertrophy    1 year follow up  . Nephrolithiasis    HPI: Patient is a 56 year old Caucasian male with a history of nephrolithiasis and BPH with LUTS who presents today for 1 year follow-up.  History of nephrolithiasis Patient underwent right ESWL for a 1.3 cm right renal stone on 02/27/2015.   Stone composition was found to be calcium slight monohydrate 45%, calcium oxalate dihydrate 35% and calcium phosphate at 5%.  Patient did not want to pursue a 24-hour urine analysis.  He did state that last Wednesday he had pain across his mid back. The pain was worse on the left side versus the right. He went to urinate and had a stall, felt pressure and then started to urinate freely again.  He feels he may have passed a stone during this time.  He did not pass a distinct fragment and he did not experience gross hematuria.  UA today was negative for microscopic hematuria.  KUB taken this morning demonstrated a tiny non obstructing right renal stone.   I have independently reviewed the films.  His UA is negative.    BPH WITH LUTS His IPSS score today is 3, which is mild lower urinary tract symptomatology. He is delighted with his quality life due to his urinary symptoms.  His previous I PSS score was 11/1.  Does complain of nocturia, but he attributes this to being awoken by dreams versus the need to urinate.   He denies any dysuria, hematuria or suprapubic pain.   He also denies any recent fevers, chills, nausea or vomiting.  He has a family history of PCa, with father being diagnosed with prostate cancer.        IPSS    Row Name 03/11/17 0900         International Prostate Symptom Score   How often have you had the sensation of not emptying your bladder? Not at  All     How often have you had to urinate less than every two hours? Less than 1 in 5 times     How often have you found you stopped and started again several times when you urinated? Not at All     How often have you found it difficult to postpone urination? Not at All     How often have you had a weak urinary stream? Less than 1 in 5 times     How often have you had to strain to start urination? Not at All     How many times did you typically get up at night to urinate? 1 Time     Total IPSS Score 3       Quality of Life due to urinary symptoms   If you were to spend the rest of your life with your urinary condition just the way it is now how would you feel about that? Delighted        Score:  1-7 Mild 8-19 Moderate 20-35 Severe     PMH: Past Medical History:  Diagnosis Date  . Allergic rhinitis   . Duodenitis   . Dysuria   . Family history of adverse reaction to anesthesia    father - slow to wake  . Gastritis   . HLD (hyperlipidemia)   .  Hypercalcemia   . Inflammatory arthritis   . Low ferritin   . Low testosterone   . Mechanical back pain   . Right humeral fracture   . Right kidney stone     Surgical History: Past Surgical History:  Procedure Laterality Date  . NASAL SINUS SURGERY    . SHOULDER ARTHROSCOPY Right 04/21/2016   Procedure: Limited arthroscopic debridement, arthroscopic subacromial decompression, and mini-open rotator cuff exploration, right shoulder.;  Surgeon: Corky Mull, MD;  Location: [place] [facility];  Service: Orthopedics;  Laterality: Right;  . TONSILLECTOMY      Home Medications:  Allergies as of 03/11/2017      Reactions   Ranitidine Hcl Cough   And sore throat      Medication List       Accurate as of 03/11/17 10:18 AM. Always use your most recent med list.          bisoprolol-hydrochlorothiazide 5-6.25 MG tablet Commonly known as:  ZIAC Take by mouth.   cholecalciferol 1000 units tablet Commonly known as:  VITAMIN  D Take 1,000 Units by mouth daily.   HYDROcodone-acetaminophen 10-325 MG tablet Commonly known as:  NORCO Take 1 tablet by mouth every 4 (four) hours as needed.   naproxen sodium 220 MG tablet Commonly known as:  ANAPROX Take by mouth.   sildenafil 20 MG tablet Commonly known as:  REVATIO 3-5 tablets once a day as needed       Allergies:  Allergies  Allergen Reactions  . Ranitidine Hcl Cough    And sore throat    Family History: Family History  Problem Relation Age of Onset  . Coronary artery disease    . Prostate cancer Father   . Kidney disease Neg Hx   . Kidney cancer Neg Hx   . Bladder Cancer Neg Hx     Social History:  reports that he has never smoked. He has never used smokeless tobacco. He reports that he does not drink alcohol or use drugs.  ROS: UROLOGY Frequent Urination?: No Hard to postpone urination?: No Burning/pain with urination?: No Get up at night to urinate?: No Leakage of urine?: No Urine stream starts and stops?: No Trouble starting stream?: No Do you have to strain to urinate?: No Blood in urine?: No Urinary tract infection?: No Sexually transmitted disease?: No Injury to kidneys or bladder?: No Painful intercourse?: No Weak stream?: No Erection problems?: No Penile pain?: No  Gastrointestinal Nausea?: No Vomiting?: No Indigestion/heartburn?: No Diarrhea?: No Constipation?: No  Constitutional Fever: No Night sweats?: No Weight loss?: No Fatigue?: No  Skin Skin rash/lesions?: No Itching?: No  Eyes Blurred vision?: No Double vision?: No  Ears/Nose/Throat Sore throat?: No Sinus problems?: No  Hematologic/Lymphatic Swollen glands?: No Easy bruising?: No  Cardiovascular Leg swelling?: No Chest pain?: No  Respiratory Cough?: No Shortness of breath?: No  Endocrine Excessive thirst?: No  Musculoskeletal Back pain?: No Joint pain?: No  Neurological Headaches?: No Dizziness?:  No  Psychologic Depression?: No Anxiety?: No  Physical Exam: BP 134/81   Pulse (!) 54   Ht 5\' 9"  (1.753 m)   Wt 196 lb (88.9 kg)   BMI 28.94 kg/m   Constitutional: Well nourished. Alert and oriented, No acute distress. HEENT: [place] AT, moist mucus membranes. Trachea midline, no masses. Cardiovascular: No clubbing, cyanosis, or edema. Respiratory: Normal respiratory effort, no increased work of breathing. GI: Abdomen is soft, non tender, non distended, no abdominal masses. Liver and spleen not palpable.  No hernias appreciated.  Stool sample for occult testing is not indicated.   GU: No CVA tenderness.  No bladder fullness or masses.  Patient with circumcised phallus.  Urethral meatus is patent.  No penile discharge. No penile lesions or rashes. Scrotum without lesions, cysts, rashes and/or edema.  Testicles are located scrotally bilaterally. No masses are appreciated in the testicles. Left and right epididymis are normal. Rectal: Patient with  normal sphincter tone. Anus and perineum without scarring or rashes. No rectal masses are appreciated. Prostate is approximately 40 grams, no nodules are appreciated. Seminal vesicles are normal. Skin: No rashes, bruises or suspicious lesions. Lymph: No cervical or inguinal adenopathy. Neurologic: Grossly intact, no focal deficits, moving all 4 extremities. Psychiatric: Normal mood and affect.  Laboratory Data: PSA history  0.78 ng/mL on 11/29/2014  0.67 ng/mL on 06/13/2015  0.61 ng/mL on 03/04/2017  Urinalysis Unremarkable.  See EPIC.    Pertinent Imaging: CLINICAL DATA:  History of kidney stones  EXAM: ABDOMEN - 1 VIEW  COMPARISON:  03/12/2016  FINDINGS: Scattered large and small bowel gas is noted. A tiny fleck like calcification is noted over the upper pole of the right kidney. No definitive ureteral stones are seen. No bony abnormality is noted.  IMPRESSION: Likely tiny nonobstructing right renal stone as  described.   Electronically Signed   By: Inez Catalina M.D.   On: 03/04/2017 15:32  Assessment & Plan:    1. History of nephrolithiasis  - KUB taken today demonstrated a tiny right renal stone  - Advised to contact our office or seek treatment in the ED if becomes febrile or pain/ vomiting are difficult control in order to arrange for emergent/urgent interventionHe will contact our office if he experiences gross hematuria or    - Urinalysis, Complete  - we will see him in one year with a KUB and symptom recheck.   2. BPH with LUTS  - IPSS score is 3/0, it is improving  - Continue conservative management, avoiding bladder irritants and timed voiding's  - RTC in 12 months for IPSS, PSA, PVR and exam   Return in about 1 year (around 03/11/2018) for IPSS, KUB, PSA and exam.  These notes generated with voice recognition software. I apologize for typographical errors.  Zara Council, [facility] [address] [place], [place] [address] [phone]

## 2017-04-13 DIAGNOSIS — R0609 Other forms of dyspnea: Secondary | ICD-10-CM

## 2017-04-13 DIAGNOSIS — I10 Essential (primary) hypertension: Secondary | ICD-10-CM | POA: Insufficient documentation

## 2017-12-29 ENCOUNTER — Other Ambulatory Visit: Payer: Self-pay | Admitting: Internal Medicine

## 2017-12-29 ENCOUNTER — Ambulatory Visit
Admission: RE | Admit: 2017-12-29 | Discharge: 2017-12-29 | Disposition: A | Discharge status: Home / Self Care | Payer: Federal, State, Local not specified - PPO | Source: Ambulatory Visit | Attending: Internal Medicine | Admitting: Internal Medicine

## 2017-12-29 DIAGNOSIS — R51 Headache: Secondary | ICD-10-CM | POA: Diagnosis not present

## 2017-12-29 DIAGNOSIS — R519 Headache, unspecified: Secondary | ICD-10-CM

## 2018-01-27 DIAGNOSIS — F958 Other tic disorders: Secondary | ICD-10-CM | POA: Insufficient documentation

## 2018-03-02 IMAGING — CR DG ABDOMEN 1V
2 series · 2 of 2 positions shown · non-contrast
Comparison: 03/12/2016

CLINICAL DATA: History of kidney stones

EXAM:
ABDOMEN - 1 VIEW

[abdomen kub (1 of 2)]
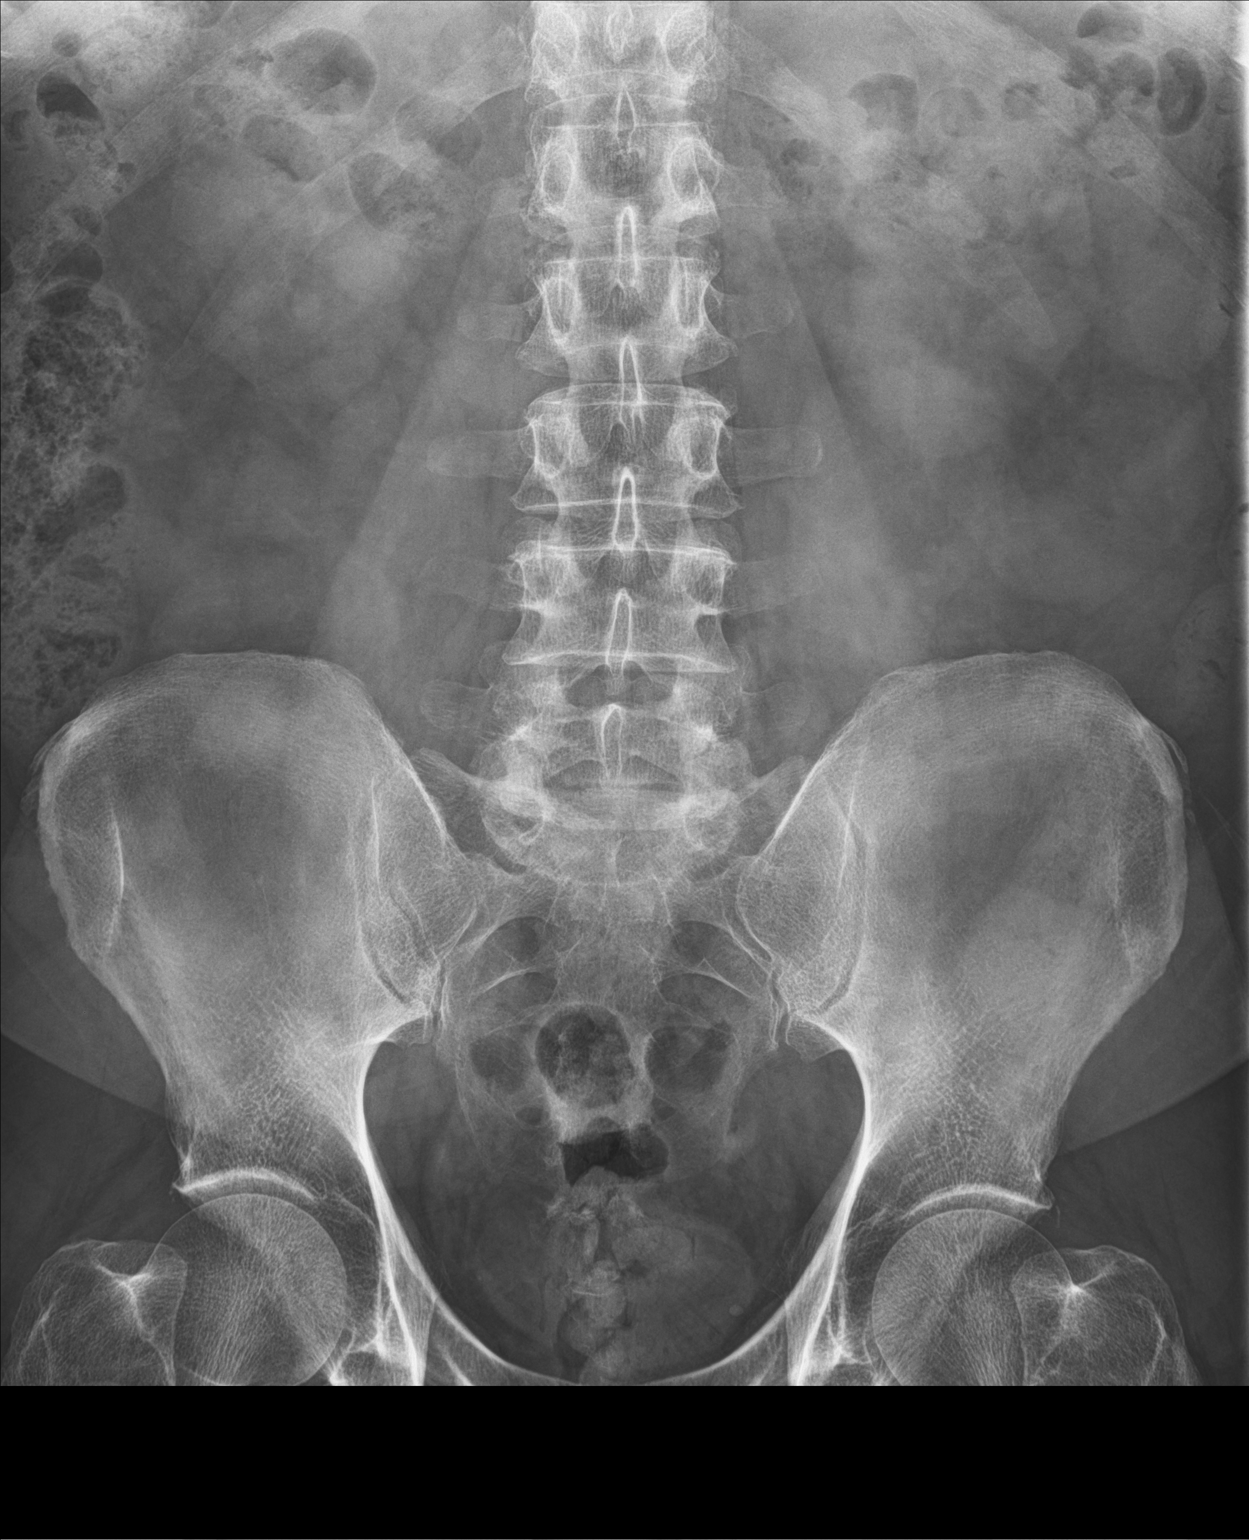

[abdomen kub (2 of 2)]
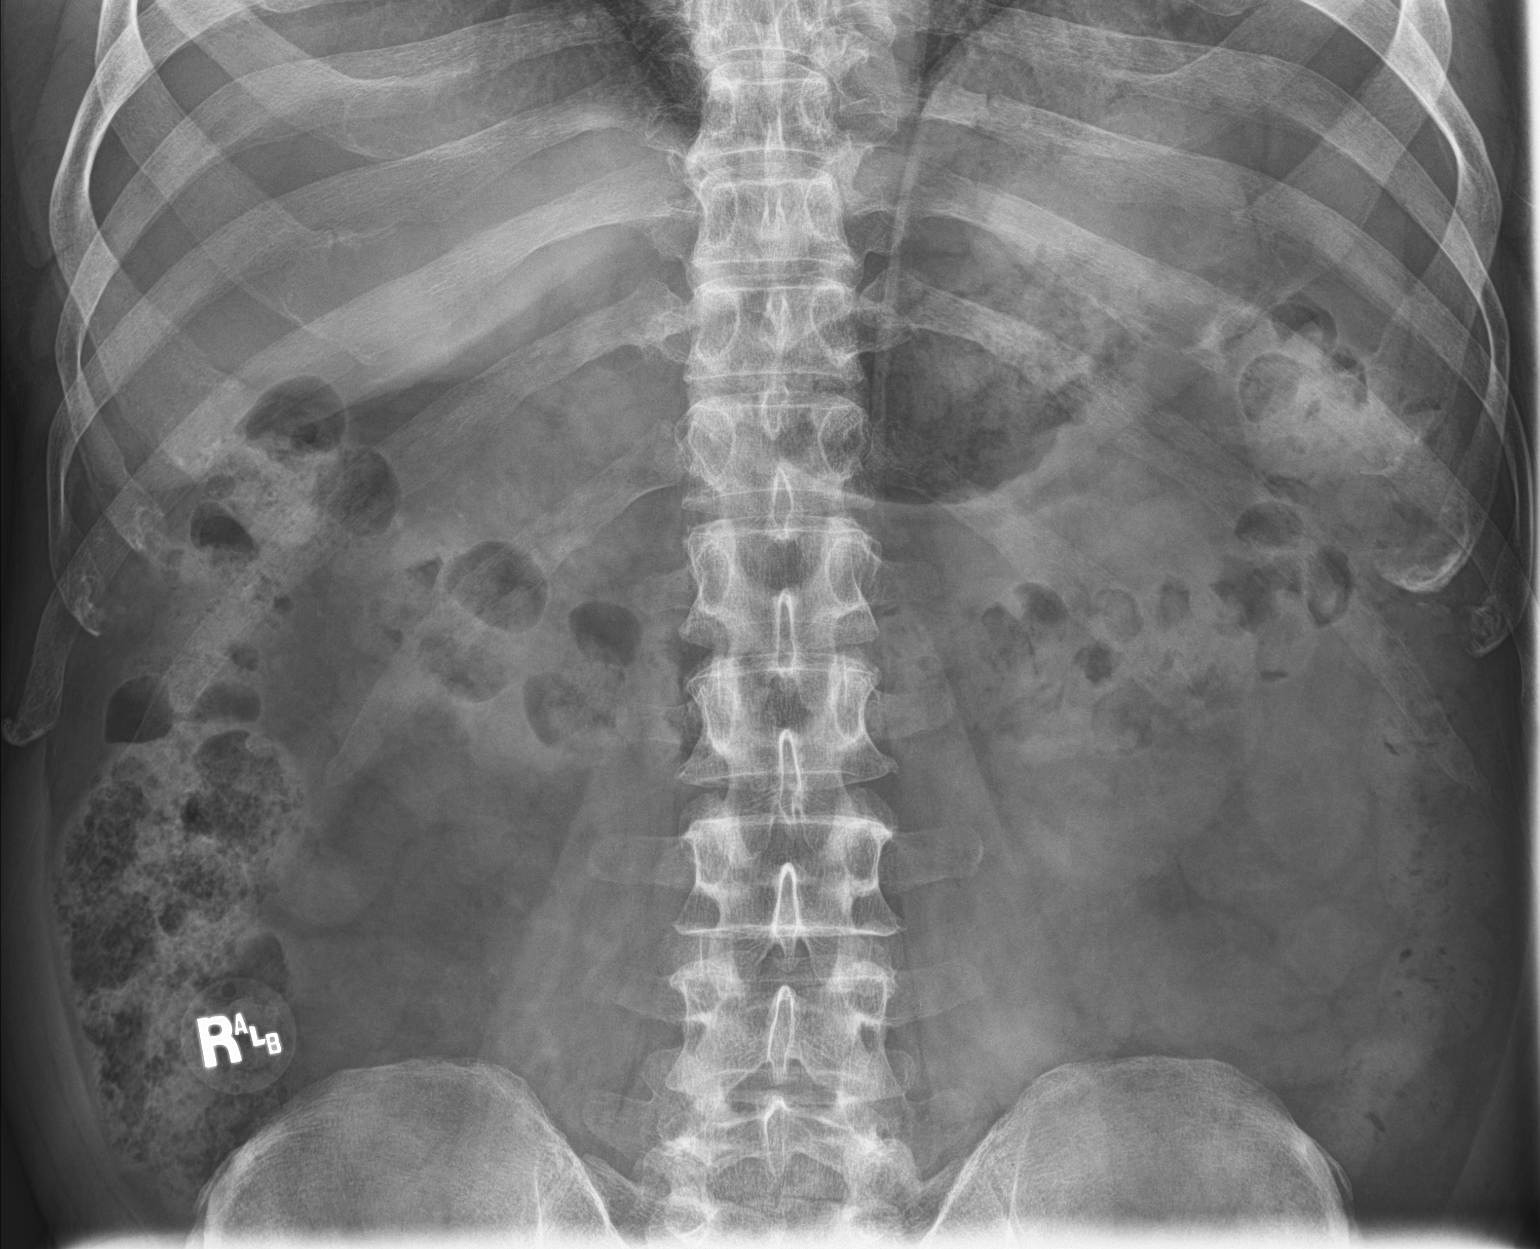

[2 of 2 positions shown; findings below may reference images not displayed]

FINDINGS: Scattered large and small bowel gas is noted. A tiny fleck like
calcification is noted over the upper pole of the right kidney. No
definitive ureteral stones are seen. No bony abnormality is noted.
IMPRESSION: Likely tiny nonobstructing right renal stone as described.

## 2018-03-03 ENCOUNTER — Ambulatory Visit
Admission: RE | Admit: 2018-03-03 | Discharge: 2018-03-03 | Disposition: A | Discharge status: Home / Self Care | Payer: Federal, State, Local not specified - PPO | Source: Ambulatory Visit | Attending: Urology | Admitting: Urology

## 2018-03-03 ENCOUNTER — Other Ambulatory Visit
Admission: RE | Admit: 2018-03-03 | Discharge: 2018-03-03 | Disposition: A | Discharge status: Home / Self Care | Payer: Federal, State, Local not specified - PPO | Source: Ambulatory Visit | Attending: Urology | Admitting: Urology

## 2018-03-03 ENCOUNTER — Other Ambulatory Visit: Payer: Self-pay | Admitting: Urology

## 2018-03-03 DIAGNOSIS — N400 Enlarged prostate without lower urinary tract symptoms: Secondary | ICD-10-CM

## 2018-03-03 DIAGNOSIS — N200 Calculus of kidney: Secondary | ICD-10-CM | POA: Insufficient documentation

## 2018-03-04 LAB — PSA: PROSTATIC SPECIFIC ANTIGEN: 0.64 ng/mL (ref 0.00–4.00)

## 2018-03-09 NOTE — Progress Notes (Signed)
03/10/2018 10:07 AM   Anthony Williamson 07/23/1961 [id number]  Referring provider: Idelle Crouch, MD [facility] [place], [place] [address]  Chief Complaint  Patient presents with  . Nephrolithiasis    1year w/KUB    HPI: Patient is a 57 year old Caucasian male with a history of nephrolithiasis and BPH with LUTS who presents today for 1 year follow-up.  History of nephrolithiasis Patient underwent right ESWL for a 1.3 cm right renal stone on 02/27/2015.   Stone composition was found to be calcium slight monohydrate 45%, calcium oxalate dihydrate 35% and calcium phosphate at 5%.  Patient did not want to pursue a 24-hour urine analysis.  KUB taken on 03/03/2018 was negative for stones.      BPH WITH LUTS His IPSS score today is 4, which is mild lower urinary tract symptomatology. He is delighted with his quality life due to his urinary symptoms.  His previous I PSS score was 3/0.   He denies any dysuria, hematuria or suprapubic pain.   He also denies any recent fevers, chills, nausea or vomiting.  He has a family history of PCa, with father being diagnosed with prostate cancer.    IPSS    Row Name 03/10/18 0900         International Prostate Symptom Score   How often have you had the sensation of not emptying your bladder?  Not at All     How often have you had to urinate less than every two hours?  Less than half the time     How often have you found you stopped and started again several times when you urinated?  Not at All     How often have you found it difficult to postpone urination?  Not at All     How often have you had a weak urinary stream?  Not at All     How often have you had to strain to start urination?  Not at All     How many times did you typically get up at night to urinate?  2 Times     Total IPSS Score  4       Quality of Life due to urinary symptoms   If you were to spend the rest of your life with your urinary condition just  the way it is now how would you feel about that?  Delighted        Score:  1-7 Mild 8-19 Moderate 20-35 Severe     PMH: Past Medical History:  Diagnosis Date  . Allergic rhinitis   . Duodenitis   . Dysuria   . Family history of adverse reaction to anesthesia    father - slow to wake  . Gastritis   . HLD (hyperlipidemia)   . Hypercalcemia   . Inflammatory arthritis   . Low ferritin   . Low testosterone   . Mechanical back pain   . Right humeral fracture   . Right kidney stone     Surgical History: Past Surgical History:  Procedure Laterality Date  . NASAL SINUS SURGERY    . SHOULDER ARTHROSCOPY Right 04/21/2016   Procedure: Limited arthroscopic debridement, arthroscopic subacromial decompression, and mini-open rotator cuff exploration, right shoulder.;  Surgeon: Corky Mull, MD;  Location: [facility];  Service: Orthopedics;  Laterality: Right;  . TONSILLECTOMY      Home Medications:  Allergies as of 03/10/2018      Reactions  Ranitidine Hcl Cough   And sore throat      Medication List        Accurate as of 03/10/18 10:07 AM. Always use your most recent med list.          bisoprolol-hydrochlorothiazide 5-6.25 MG tablet Commonly known as:  ZIAC Take by mouth.   cholecalciferol 1000 units tablet Commonly known as:  VITAMIN D Take 1,000 Units by mouth daily.   cloNIDine 0.1 mg/24hr patch Commonly known as:  CATAPRES - Dosed in mg/24 hr   sildenafil 20 MG tablet Commonly known as:  REVATIO 3-5 tablets once a day as needed       Allergies:  Allergies  Allergen Reactions  . Ranitidine Hcl Cough    And sore throat    Family History: Family History  Problem Relation Age of Onset  . Coronary artery disease Unknown   . Prostate cancer Father   . Kidney disease Neg Hx   . Kidney cancer Neg Hx   . Bladder Cancer Neg Hx     Social History:  reports that  has never smoked. he has never used smokeless tobacco. He reports that he does  not drink alcohol or use drugs.  ROS: UROLOGY Frequent Urination?: No Hard to postpone urination?: No Burning/pain with urination?: No Get up at night to urinate?: No Leakage of urine?: No Urine stream starts and stops?: No Trouble starting stream?: No Do you have to strain to urinate?: No Blood in urine?: No Urinary tract infection?: No Sexually transmitted disease?: No Injury to kidneys or bladder?: No Painful intercourse?: No Weak stream?: No Erection problems?: No Penile pain?: No  Gastrointestinal Nausea?: No Vomiting?: No Indigestion/heartburn?: No Diarrhea?: No Constipation?: No  Constitutional Fever: No Night sweats?: No Weight loss?: No Fatigue?: No  Skin Skin rash/lesions?: No Itching?: No  Eyes Blurred vision?: No Double vision?: No  Ears/Nose/Throat Sore throat?: No Sinus problems?: No  Hematologic/Lymphatic Swollen glands?: No Easy bruising?: No  Cardiovascular Leg swelling?: No Chest pain?: No  Respiratory Cough?: No Shortness of breath?: No  Endocrine Excessive thirst?: No  Musculoskeletal Back pain?: No Joint pain?: No  Neurological Headaches?: No Dizziness?: No  Psychologic Depression?: No Anxiety?: No  Physical Exam: BP (!) 147/73   Pulse (!) 57   Ht 5\' 9"  (1.753 m)   Wt 185 lb (83.9 kg)   BMI 27.32 kg/m   Constitutional: Well nourished. Alert and oriented, No acute distress. HEENT: [place] AT, moist mucus membranes. Trachea midline, no masses. Cardiovascular: No clubbing, cyanosis, or edema. Respiratory: Normal respiratory effort, no increased work of breathing. GI: Abdomen is soft, non tender, non distended, no abdominal masses. Liver and spleen not palpable.  No hernias appreciated.  Stool sample for occult testing is not indicated.   GU: No CVA tenderness.  No bladder fullness or masses.  Patient with circumcised phallus.  Urethral meatus is patent.  No penile discharge. No penile lesions or rashes. Scrotum  without lesions, cysts, rashes and/or edema.  Testicles are located scrotally bilaterally. No masses are appreciated in the testicles. Left and right epididymis are normal. Rectal: Patient with  normal sphincter tone. Anus and perineum without scarring or rashes. No rectal masses are appreciated. Prostate is approximately 40 grams, no nodules are appreciated. Seminal vesicles are normal. Skin: No rashes, bruises or suspicious lesions. Lymph: No cervical or inguinal adenopathy. Neurologic: Grossly intact, no focal deficits, moving all 4 extremities. Psychiatric: Normal mood and affect.   Laboratory Data: PSA history  0.78 ng/mL  on 11/29/2014  0.67 ng/mL on 06/13/2015  0.61 ng/mL on 03/04/2017  0.64 ng/mL on 03/03/2018  I have reviewed the labs.  Pertinent Imaging: CLINICAL DATA:  History of kidney stones.  Currently asymptomatic.  EXAM: ABDOMEN - 1 VIEW  COMPARISON:  Abdominal x-ray dated March 04, 2017.  FINDINGS: The bowel gas pattern is normal. No radio-opaque calculi or other significant radiographic abnormality are seen.  IMPRESSION: Negative.   Electronically Signed   By: Titus Dubin M.D.   On: 03/03/2018 16:26    I have independently reviewed the films  Assessment & Plan:    1. History of nephrolithiasis  - KUB taken today demonstrated no stones  - Advised to contact our office or seek treatment in the ED if becomes febrile or pain/ vomiting are difficult control in order to arrange for emergent/urgent intervention He will contact our office if he experiences gross hematuria or    - we will see him in one year with a KUB and symptom recheck.   2. BPH with LUTS  - IPSS score is 3/0, it is improving  - Continue conservative management, avoiding bladder irritants and timed voiding's  - RTC in 12 months for IPSS, PSA, PVR and exam   3. PSA Screening I discussed with the patient that PSA is an acronym for  prostate specific antigen,  which is a protein  made by the prostate gland and can be detected in the blood stream. I explained to the patient situations that would increase the PSA, such as: a man's age,  BPH, infection, recent intercourse/ejaculation, prostate infarction, recent urethroscopic manipulation (Foley placement/cystoscopy) and prostate cancer.    Return in about 1 year (around 03/11/2019) for IPSS, PSA , KUB and exam.  These notes generated with voice recognition software. I apologize for typographical errors.  Zara Council, [facility] [address] [place], [place] [address] [phone]

## 2018-03-10 ENCOUNTER — Ambulatory Visit (INDEPENDENT_AMBULATORY_CARE_PROVIDER_SITE_OTHER): Payer: Federal, State, Local not specified - PPO | Admitting: Urology

## 2018-03-10 ENCOUNTER — Encounter: Payer: Self-pay | Admitting: Urology

## 2018-03-10 VITALS — BP 147/73 | HR 57 | Ht 69.0 in | Wt 185.0 lb

## 2018-03-10 DIAGNOSIS — Z125 Encounter for screening for malignant neoplasm of prostate: Secondary | ICD-10-CM | POA: Diagnosis not present

## 2018-03-10 DIAGNOSIS — N401 Enlarged prostate with lower urinary tract symptoms: Secondary | ICD-10-CM

## 2018-03-10 DIAGNOSIS — R790 Abnormal level of blood mineral: Secondary | ICD-10-CM | POA: Insufficient documentation

## 2018-03-10 DIAGNOSIS — N138 Other obstructive and reflux uropathy: Secondary | ICD-10-CM

## 2018-03-10 DIAGNOSIS — K2990 Gastroduodenitis, unspecified, without bleeding: Secondary | ICD-10-CM | POA: Insufficient documentation

## 2018-03-10 DIAGNOSIS — S335XXA Sprain of ligaments of lumbar spine, initial encounter: Secondary | ICD-10-CM | POA: Insufficient documentation

## 2018-03-10 DIAGNOSIS — M545 Low back pain, unspecified: Secondary | ICD-10-CM | POA: Insufficient documentation

## 2018-03-10 DIAGNOSIS — Z87442 Personal history of urinary calculi: Secondary | ICD-10-CM

## 2018-03-10 DIAGNOSIS — E785 Hyperlipidemia, unspecified: Secondary | ICD-10-CM | POA: Insufficient documentation

## 2018-03-10 DIAGNOSIS — J309 Allergic rhinitis, unspecified: Secondary | ICD-10-CM | POA: Insufficient documentation

## 2018-03-10 DIAGNOSIS — M1990 Unspecified osteoarthritis, unspecified site: Secondary | ICD-10-CM | POA: Insufficient documentation

## 2018-03-10 DIAGNOSIS — S42301A Unspecified fracture of shaft of humerus, right arm, initial encounter for closed fracture: Secondary | ICD-10-CM | POA: Insufficient documentation

## 2018-05-01 ENCOUNTER — Other Ambulatory Visit: Payer: Self-pay

## 2018-05-01 ENCOUNTER — Encounter: Payer: Self-pay | Admitting: Emergency Medicine

## 2018-05-01 DIAGNOSIS — Z23 Encounter for immunization: Secondary | ICD-10-CM | POA: Insufficient documentation

## 2018-05-01 DIAGNOSIS — Y92008 Other place in unspecified non-institutional (private) residence as the place of occurrence of the external cause: Secondary | ICD-10-CM | POA: Insufficient documentation

## 2018-05-01 DIAGNOSIS — W228XXA Striking against or struck by other objects, initial encounter: Secondary | ICD-10-CM | POA: Diagnosis not present

## 2018-05-01 DIAGNOSIS — Y939 Activity, unspecified: Secondary | ICD-10-CM | POA: Insufficient documentation

## 2018-05-01 DIAGNOSIS — S0101XA Laceration without foreign body of scalp, initial encounter: Secondary | ICD-10-CM | POA: Insufficient documentation

## 2018-05-01 DIAGNOSIS — I10 Essential (primary) hypertension: Secondary | ICD-10-CM | POA: Insufficient documentation

## 2018-05-01 DIAGNOSIS — Y999 Unspecified external cause status: Secondary | ICD-10-CM | POA: Diagnosis not present

## 2018-05-01 DIAGNOSIS — S0990XA Unspecified injury of head, initial encounter: Secondary | ICD-10-CM | POA: Diagnosis present

## 2018-05-01 NOTE — ED Triage Notes (Signed)
Patient ambulatory to triage with steady gait, without difficulty or distress noted; pt reports falling in attic hr PTA hitting head on rafter; superficial lac noted to right side of scalp with no active bleeding; denies LOC or HA

## 2018-05-02 ENCOUNTER — Emergency Department
Admission: EM | Admit: 2018-05-02 | Discharge: 2018-05-02 | Disposition: A | Discharge status: Home / Self Care | Payer: Federal, State, Local not specified - PPO | Attending: Emergency Medicine | Admitting: Emergency Medicine

## 2018-05-02 DIAGNOSIS — S0101XA Laceration without foreign body of scalp, initial encounter: Secondary | ICD-10-CM

## 2018-05-02 DIAGNOSIS — S0990XA Unspecified injury of head, initial encounter: Secondary | ICD-10-CM

## 2018-05-02 MED ORDER — LIDOCAINE HCL (PF) 1 % IJ SOLN
INTRAMUSCULAR | Status: AC
  Administered 2018-05-02: 5 mL
  Filled 2018-05-02: qty 5

## 2018-05-02 MED ORDER — LIDOCAINE HCL (PF) 1 % IJ SOLN
5.0000 mL | Freq: Once | INTRAMUSCULAR | Status: AC
  Administered 2018-05-02: 5 mL

## 2018-05-02 MED ORDER — TETANUS-DIPHTH-ACELL PERTUSSIS 5-2.5-18.5 LF-MCG/0.5 IM SUSP
0.5000 mL | Freq: Once | INTRAMUSCULAR | Status: AC
  Administered 2018-05-02: 0.5 mL via INTRAMUSCULAR
  Filled 2018-05-02: qty 0.5

## 2018-05-02 MED ORDER — BACITRACIN ZINC 500 UNIT/GM EX OINT
TOPICAL_OINTMENT | Freq: Once | CUTANEOUS | Status: AC
  Administered 2018-05-02: 1 via TOPICAL
  Filled 2018-05-02: qty 0.9

## 2018-05-02 MED ORDER — BACITRACIN ZINC 500 UNIT/GM EX OINT: TOPICAL_OINTMENT | CUTANEOUS | 0 refills | Status: DC

## 2018-05-02 NOTE — ED Notes (Signed)
Pt has a 2" to 2 1/2" laceration to the RIGHT upper side of the scalp. No bleeding at this time, pt denies LOC with injury.

## 2018-05-02 NOTE — Discharge Instructions (Addendum)
Please have your sutures removed in 5-7 days

## 2018-05-02 NOTE — ED Provider Notes (Signed)
[facility] Emergency Department Provider Note   ____________________________________________   First MD Initiated Contact with Patient 05/02/18 0118     (approximate)  I have reviewed the triage vital signs and the nursing notes.   HISTORY  Chief Complaint Head Injury    HPI Anthony Williamson is a 57 y.o. male who comes into the hospital today with a laceration to the right side of his head.  The patient states that he injured himself in the attic.  A piece of the floor gave away and he hit his head on a rafter.  He thinks that they might of been a nail on the right after.  He has a laceration to the right side of his head.  He thinks he may have had his last tetanus several years ago may be 5 or 6 or longer.  He states that his pain is a 2 out of 10 in intensity and feels like a sting.  The patient denies any severe pain.  He states that he feels like he has an abrasion on his neck.  He has had some headaches off and on for the past 2 weeks but he is currently seeing Dr. Manuella Ghazi who is treating his headaches.  The patient is here today for evaluation of his symptoms.  Past Medical History:  Diagnosis Date  . Allergic rhinitis   . Duodenitis   . Dysuria   . Family history of adverse reaction to anesthesia    father - slow to wake  . Gastritis   . HLD (hyperlipidemia)   . Hypercalcemia   . Inflammatory arthritis   . Low ferritin   . Low testosterone   . Mechanical back pain   . Right humeral fracture   . Right kidney stone     Patient Active Problem List   Diagnosis Date Noted  . Allergic rhinitis 03/10/2018  . Gastritis and duodenitis 03/10/2018  . Hypercalcemia 03/10/2018  . Hyperlipidemia, unspecified 03/10/2018  . Inflammatory arthritis 03/10/2018  . Low ferritin 03/10/2018  . Lumbar sprain 03/10/2018  . Low back pain 03/10/2018  . Right humeral fracture 03/10/2018  . Motor tic disorder 01/27/2018  . Dyspnea on effort 04/13/2017  .  Hypertension, essential 04/13/2017  . Vitamin D deficiency, unspecified 01/21/2017  . Bursitis of right shoulder 04/22/2016  . History of nephrolithiasis 03/17/2016  . 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 Low testosterone 08/29/2014    Past Surgical History:  Procedure Laterality Date  . NASAL SINUS SURGERY    . SHOULDER ARTHROSCOPY Right 04/21/2016   Procedure: Limited arthroscopic debridement, arthroscopic subacromial decompression, and mini-open rotator cuff exploration, right shoulder.;  Surgeon: Corky Mull, MD;  Location: [facility];  Service: Orthopedics;  Laterality: Right;  . TONSILLECTOMY      Prior to Admission medications   Medication Sig Start Date End Date Taking? Authorizing Provider  bacitracin ointment Apply to affected area twice daily 05/02/18 05/02/19  Loney Hering, MD  bisoprolol-hydrochlorothiazide [facility]) 5-6.25 MG tablet Take by mouth. 01/21/17 01/21/18  [provider]  cholecalciferol (VITAMIN D) 1000 units tablet Take 1,000 Units by mouth daily.    [provider]  cloNIDine (CATAPRES - DOSED IN MG/24 HR) 0.1 mg/24hr patch  02/26/18   [provider]  sildenafil (REVATIO) 20 MG tablet 3-5 tablets once a day as needed 01/21/17   [provider]    Allergies Ranitidine hcl  Family History  Problem Relation Age of Onset  .  Coronary artery disease Unknown   . Prostate cancer Father   . Kidney disease Neg Hx   . Kidney cancer Neg Hx   . Bladder Cancer Neg Hx     Social History Social History   Tobacco Use  . Smoking status: Never Smoker  . Smokeless tobacco: Never Used  Substance Use Topics  . Alcohol use: No    Alcohol/week: 0.0 oz    Comment: rare - 1x/6 mos  . Drug use: No    Review of Systems  Constitutional: No fever/chills Eyes: No visual changes. ENT: No sore throat. Cardiovascular: Denies chest pain. Respiratory: Denies shortness of breath. Gastrointestinal: No  abdominal pain.  No nausea, no vomiting.  No diarrhea.  No constipation. Genitourinary: Negative for dysuria. Musculoskeletal: Negative for back pain. Skin: Scalp laceration Neurological: Negative for headaches, focal weakness or numbness.   ____________________________________________   PHYSICAL EXAM:  VITAL SIGNS: ED Triage Vitals  Enc Vitals Group     BP 05/01/18 2253 (!) 197/94     Pulse Rate 05/01/18 2253 70     Resp 05/01/18 2253 18     Temp 05/01/18 2253 98.2 F (36.8 C)     Temp Source 05/01/18 2253 Oral     SpO2 05/01/18 2253 97 %     Weight 05/01/18 2252 190 lb (86.2 kg)     Height 05/01/18 2252 5\' 9"  (1.753 m)     Head Circumference --      Peak Flow --      Pain Score 05/01/18 2252 0     Pain Loc --      Pain Edu? --      Excl. in [place]? --     Constitutional: Alert and oriented. Well appearing and in mild distress. Eyes: Conjunctivae are normal. PERRL. EOMI. Head: 5 to 6 cm laceration to right scalp. Nose: No congestion/rhinnorhea. Mouth/Throat: Mucous membranes are moist.  Oropharynx non-erythematous. Cardiovascular: Normal rate, regular rhythm. Grossly normal heart sounds.  Good peripheral circulation. Respiratory: Normal respiratory effort.  No retractions. Lungs CTAB. Gastrointestinal: Soft and nontender. No distention.  Positive bowel sounds Musculoskeletal: No lower extremity tenderness nor edema.   Neurologic:  Normal speech and language.  Skin:  Skin is warm, dry and intact. Marland Kitchen Psychiatric: Mood and affect are normal.   ____________________________________________   LABS (all labs ordered are listed, but only abnormal results are displayed)  Labs Reviewed - No data to display ____________________________________________  EKG  none ____________________________________________  RADIOLOGY  ED MD interpretation:  none  Official radiology report(s): No results  found.  ____________________________________________   PROCEDURES  Procedure(s) performed: please, see procedure note(s).  Marland Kitchen.Laceration Repair Date/Time: 05/02/2018 2:37 AM Performed by: Loney Hering, MD Authorized by: Loney Hering, MD   Consent:    Consent obtained:  Verbal   Consent given by:  Patient   Risks discussed:  Infection, pain, retained foreign body, poor cosmetic result and poor wound healing Anesthesia (see MAR for exact dosages):    Anesthesia method:  Local infiltration   Local anesthetic:  Lidocaine 1% w/o epi Laceration details:    Location:  Scalp   Scalp location:  R parietal   Length (cm):  5 Repair type:    Repair type:  Simple Exploration:    Hemostasis achieved with:  Direct pressure   Wound exploration: entire depth of wound probed and visualized     Contaminated: no   Treatment:    Area cleansed with:  Saline, Betadine and Hibiclens  Amount of cleaning:  Standard   Irrigation solution:  Sterile saline   Visualized foreign bodies/material removed: no   Skin repair:    Repair method:  Sutures   Suture size:  5-0   Suture material:  Nylon   Suture technique:  Running locked   Number of sutures:  7 Approximation:    Approximation:  Close Post-procedure details:    Dressing:  Antibiotic ointment   Patient tolerance of procedure:  Tolerated well, no immediate complications    Critical Care performed: No  ____________________________________________   INITIAL IMPRESSION / ASSESSMENT AND PLAN / ED COURSE  As part of my medical decision making, I reviewed the following data within the electronic MEDICAL RECORD NUMBER Notes from prior ED visits and [place] Controlled Substance Database   This is a 57 year old male who comes into the hospital today with a scalp laceration.  I will give the patient a Tdap and I will suture his wound.  He will be reassessed.  The patient's wound was sutured.  He will be discharged home.  I encouraged him to  have his sutures removed in 5 to 7 days.  He will be given some bacitracin for home.  The patient has no further questions or concerns at this time.      ____________________________________________   FINAL CLINICAL IMPRESSION(S) / ED DIAGNOSES  Final diagnoses:  Injury of head, initial encounter  Laceration of scalp, initial encounter     ED Discharge Orders        Ordered    bacitracin ointment     05/02/18 0239       Note:  This document was prepared using Dragon voice recognition software and may include unintentional dictation errors.    Loney Hering, MD 05/02/18 [phone]

## 2018-12-01 ENCOUNTER — Encounter
Admission: RE | Disposition: A | Discharge status: Home / Self Care | Payer: Self-pay | Source: Ambulatory Visit | Attending: Gastroenterology

## 2018-12-01 ENCOUNTER — Other Ambulatory Visit: Payer: Self-pay

## 2018-12-01 ENCOUNTER — Ambulatory Visit: Payer: Federal, State, Local not specified - PPO | Admitting: Anesthesiology

## 2018-12-01 ENCOUNTER — Ambulatory Visit
Admission: RE | Admit: 2018-12-01 | Discharge: 2018-12-01 | Disposition: A | Discharge status: Home / Self Care | Payer: Federal, State, Local not specified - PPO | Source: Ambulatory Visit | Attending: Gastroenterology | Admitting: Gastroenterology

## 2018-12-01 DIAGNOSIS — M1990 Unspecified osteoarthritis, unspecified site: Secondary | ICD-10-CM | POA: Insufficient documentation

## 2018-12-01 DIAGNOSIS — K644 Residual hemorrhoidal skin tags: Secondary | ICD-10-CM | POA: Diagnosis not present

## 2018-12-01 DIAGNOSIS — E785 Hyperlipidemia, unspecified: Secondary | ICD-10-CM | POA: Insufficient documentation

## 2018-12-01 DIAGNOSIS — K641 Second degree hemorrhoids: Secondary | ICD-10-CM | POA: Diagnosis not present

## 2018-12-01 DIAGNOSIS — Z1211 Encounter for screening for malignant neoplasm of colon: Secondary | ICD-10-CM | POA: Insufficient documentation

## 2018-12-01 DIAGNOSIS — Z791 Long term (current) use of non-steroidal anti-inflammatories (NSAID): Secondary | ICD-10-CM | POA: Diagnosis not present

## 2018-12-01 DIAGNOSIS — N400 Enlarged prostate without lower urinary tract symptoms: Secondary | ICD-10-CM | POA: Diagnosis not present

## 2018-12-01 DIAGNOSIS — D124 Benign neoplasm of descending colon: Secondary | ICD-10-CM | POA: Insufficient documentation

## 2018-12-01 HISTORY — PX: COLONOSCOPY WITH PROPOFOL: SHX5780

## 2018-12-01 SURGERY — COLONOSCOPY WITH PROPOFOL
Anesthesia: General

## 2018-12-01 MED ORDER — SODIUM CHLORIDE 0.9 % IV SOLN
INTRAVENOUS | Status: DC
  Administered 2018-12-01: 15:00:00 via INTRAVENOUS

## 2018-12-01 MED ORDER — PROPOFOL 10 MG/ML IV BOLUS
INTRAVENOUS | Status: DC | PRN
  Administered 2018-12-01: 100 mg via INTRAVENOUS

## 2018-12-01 MED ORDER — PROPOFOL 500 MG/50ML IV EMUL
INTRAVENOUS | Status: AC
  Filled 2018-12-01: qty 50

## 2018-12-01 MED ORDER — FENTANYL CITRATE (PF) 100 MCG/2ML IJ SOLN
INTRAMUSCULAR | Status: DC | PRN
  Administered 2018-12-01: 100 ug via INTRAVENOUS

## 2018-12-01 MED ORDER — FENTANYL CITRATE (PF) 100 MCG/2ML IJ SOLN
INTRAMUSCULAR | Status: AC
  Filled 2018-12-01: qty 2

## 2018-12-01 MED ORDER — PROPOFOL 500 MG/50ML IV EMUL
INTRAVENOUS | Status: DC | PRN
  Administered 2018-12-01: 190 ug/kg/min via INTRAVENOUS

## 2018-12-01 MED ORDER — LIDOCAINE 2% (20 MG/ML) 5 ML SYRINGE
INTRAMUSCULAR | Status: DC | PRN
  Administered 2018-12-01: 30 mg via INTRAVENOUS

## 2018-12-01 NOTE — H&P (Signed)
Outpatient short stay form Pre-procedure 12/01/2018 2:28 PM Lollie Sails MD  Primary Physician: Dr. Fulton Reek  Reason for visit: Colonoscopy  History of present illness: Patient is a 57 year old male presenting today for a screening colonoscopy.  His last colonoscopy was in 2010.  He had a small distal rectal mass removed that was consistent with a fibroepithelial polyp and hemorrhoidal varices.  Patient tolerated his prep well.  He takes no aspirin or blood thinning agent.     No current facility-administered medications for this encounter.   Medications Prior to Admission  Medication Sig Dispense Refill Last Dose  . butalbital-acetaminophen-caffeine (FIORICET, ESGIC) 50-325-40 MG tablet Take 2 tablets by mouth every 4 (four) hours as needed for headache.     . hydrochlorothiazide (HYDRODIURIL) 12.5 MG tablet Take 12.5 mg by mouth daily.     . hydrochlorothiazide (MICROZIDE) 12.5 MG capsule Take 12.5 mg by mouth daily.     Marland Kitchen HYDROcodone-acetaminophen (NORCO) 10-325 MG tablet Take 1 tablet by mouth every 6 (six) hours as needed.     . Magnesium Oxide 500 MG TABS Take 1 tablet by mouth daily.     . Naproxen (NAPROSYN PO) Take 220 mg by mouth as needed.     . bacitracin ointment Apply to affected area twice daily 30 g 0   . bisoprolol-hydrochlorothiazide (ZIAC) 5-6.25 MG tablet Take by mouth.   Taking  . cholecalciferol (VITAMIN D) 1000 units tablet Take 1,000 Units by mouth daily.   Taking  . cloNIDine (CATAPRES - DOSED IN MG/24 HR) 0.1 mg/24hr patch    Not Taking at Unknown time  . sildenafil (REVATIO) 20 MG tablet 3-5 tablets once a day as needed   Not Taking     Allergies  Allergen Reactions  . Zithromax [Azithromycin] Other (See Comments)    Unknown  . Etodolac Other (See Comments)  . Ranitidine Hcl Cough    And sore throat     Past Medical History:  Diagnosis Date  . Allergic rhinitis   . Duodenitis   . Dysuria   . Family history of adverse reaction to  anesthesia    father - slow to wake  . Gastritis   . HLD (hyperlipidemia)   . Hypercalcemia   . Inflammatory arthritis   . Low ferritin   . Low testosterone   . Mechanical back pain   . Right humeral fracture   . Right kidney stone     Review of systems:      Physical Exam    Heart and lungs: Regular rate and rhythm without rub or gallop, lungs are bilaterally clear.    HEENT: Normocephalic atraumatic eyes are anicteric    Other:    Pertinant exam for procedure: Soft nontender nondistended bowel sounds positive normoactive.    Planned proceedures: Colonoscopy and indicated procedures. I have discussed the risks benefits and complications of procedures to include not limited to bleeding, infection, perforation and the risk of sedation and the patient wishes to proceed.    Lollie Sails, MD Gastroenterology 12/01/2018  2:28 PM

## 2018-12-01 NOTE — Transfer of Care (Signed)
Immediate Anesthesia Transfer of Care Note  Patient: Anthony Williamson  Procedure(s) Performed: Procedure(s): COLONOSCOPY WITH PROPOFOL (N/A)  Patient Location: PACU and Endoscopy Unit  Anesthesia Type:General  Level of Consciousness: sedated  Airway & Oxygen Therapy: Patient Spontanous Breathing and Patient connected to nasal cannula oxygen  Post-op Assessment: Report given to RN and Post -op Vital signs reviewed and stable  Post vital signs: Reviewed and stable  Last Vitals:  Vitals:   12/01/18 1525 12/01/18 1528  BP: (!) 104/58 (!) 104/58  Pulse: 74 66  Resp: 18 14  Temp: (!) 36 C   SpO2: 40% 10%    Complications: No apparent anesthesia complications

## 2018-12-01 NOTE — Op Note (Signed)
[facility] Gastroenterology Patient Name: Asim Gersten Procedure Date: 12/01/2018 2:37 PM MRN: [id number] Account #: [id number] Date of Birth: 1961-12-11 Admit Type: Outpatient Age: 57 Room: [facility] ENDO ROOM 3 Gender: Male Note Status: Finalized Procedure:            Colonoscopy Indications:          Screening for colorectal malignant neoplasm Providers:            Lollie Sails, MD Referring MD:         Leonie Douglas. Doy Hutching, MD (Referring MD) Medicines:            Monitored Anesthesia Care Complications:        No immediate complications. Procedure:            Pre-Anesthesia Assessment:                       - ASA Grade Assessment: II - A patient with mild                        systemic disease.                       After obtaining informed consent, the colonoscope was                        passed under direct vision. Throughout the procedure,                        the patient's blood pressure, pulse, and oxygen                        saturations were monitored continuously. The                        Colonoscope was introduced through the anus and                        advanced to the the cecum, identified by appendiceal                        orifice and ileocecal valve. The colonoscopy was                        performed without difficulty. The patient tolerated the                        procedure well. The quality of the bowel preparation                        was good. Findings:      A 3 mm polyp was found in the descending colon. The polyp was sessile.       The polyp was removed with a cold biopsy forceps. Resection and       retrieval were complete.      Non-bleeding external and internal hemorrhoids were found during       retroflexion, during digital exam and during anoscopy. The hemorrhoids       were small and Grade II (internal hemorrhoids that prolapse but reduce       spontaneously).      The digital rectal exam findings include  enlarged  prostate. Impression:           - One 3 mm polyp in the descending colon, removed with                        a cold biopsy forceps. Resected and retrieved.                       - Non-bleeding external and internal hemorrhoids.                       - Enlarged prostate found on digital rectal exam. Recommendation:       - Discharge patient to home.                       - Await pathology results.                       - Telephone GI clinic for pathology results in 1 week.                       - continue following with primary MD for prostate health                       - Use Analpram HC Cream 2.5%: Apply externally TID for                        10 days. Procedure Code(s):    --- Professional ---                       [phone], Colonoscopy, flexible; with biopsy, single or                        multiple Diagnosis Code(s):    --- Professional ---                       Z12.11, Encounter for screening for malignant neoplasm                        of colon                       D12.4, Benign neoplasm of descending colon                       K64.1, Second degree hemorrhoids                       N40.0, Benign prostatic hyperplasia without lower                        urinary tract symptoms CPT copyright 2018 American Medical Association. All rights reserved. The codes documented in this report are preliminary and upon coder review may  be revised to meet current compliance requirements. Lollie Sails, MD 12/01/2018 3:29:23 PM This report has been signed electronically. Number of Addenda: 0 Note Initiated On: 12/01/2018 2:37 PM Scope Withdrawal Time: 0 hours 11 minutes 18 seconds  Total Procedure Duration: 0 hours 19 minutes 53 seconds       [facility]

## 2018-12-01 NOTE — Anesthesia Preprocedure Evaluation (Signed)
Anesthesia Evaluation  Patient identified by MRN, date of birth, ID band Patient awake    Reviewed: Allergy & Precautions, NPO status , Patient's Chart, lab work & pertinent test results  History of Anesthesia Complications Negative for: history of anesthetic complications  Airway Mallampati: II  TM Distance: >3 FB Neck ROM: Full    Dental no notable dental hx.    Pulmonary neg pulmonary ROS, neg sleep apnea, neg COPD,    breath sounds clear to auscultation- rhonchi (-) wheezing      Cardiovascular hypertension, (-) CAD, (-) Past MI, (-) Cardiac Stents and (-) CABG  Rhythm:Regular Rate:Normal - Systolic murmurs and - Diastolic murmurs    Neuro/Psych negative neurological ROS  negative psych ROS   GI/Hepatic negative GI ROS, Neg liver ROS,   Endo/Other  negative endocrine ROSneg diabetes  Renal/GU Renal disease: hx of nephrolithiasis.     Musculoskeletal  (+) Arthritis ,   Abdominal (+) - obese,   Peds  Hematology negative hematology ROS (+)   Anesthesia Other Findings Past Medical History: No date: Allergic rhinitis No date: Duodenitis No date: Dysuria No date: Family history of adverse reaction to anesthesia     Comment:  father - slow to wake No date: Gastritis No date: HLD (hyperlipidemia) No date: Hypercalcemia No date: Inflammatory arthritis No date: Low ferritin No date: Low testosterone No date: Mechanical back pain No date: Right humeral fracture No date: Right kidney stone   Reproductive/Obstetrics                             Anesthesia Physical Anesthesia Plan  ASA: II  Anesthesia Plan: General   Post-op Pain Management:    Induction: Intravenous  PONV Risk Score and Plan: 1 and Propofol infusion  Airway Management Planned: Natural Airway  Additional Equipment:   Intra-op Plan:   Post-operative Plan:   Informed Consent: I have reviewed the patients  History and Physical, chart, labs and discussed the procedure including the risks, benefits and alternatives for the proposed anesthesia with the patient or authorized representative who has indicated his/her understanding and acceptance.   Dental advisory given  Plan Discussed with: CRNA and Anesthesiologist  Anesthesia Plan Comments:         Anesthesia Quick Evaluation

## 2018-12-01 NOTE — Anesthesia Post-op Follow-up Note (Signed)
Anesthesia QCDR form completed.        

## 2018-12-02 NOTE — Anesthesia Postprocedure Evaluation (Signed)
Anesthesia Post Note  Patient: Anthony Williamson  Procedure(s) Performed: COLONOSCOPY WITH PROPOFOL (N/A )  Patient location during evaluation: PACU Anesthesia Type: General Level of consciousness: awake and alert and oriented Pain management: pain level controlled Vital Signs Assessment: post-procedure vital signs reviewed and stable Respiratory status: spontaneous breathing Cardiovascular status: blood pressure returned to baseline Anesthetic complications: no     Last Vitals:  Vitals:   12/01/18 1545 12/01/18 1555  BP: 116/78 125/78  Pulse: 62 (!) 56  Resp: 15 12  Temp:    SpO2: 95% 96%    Last Pain:  Vitals:   12/01/18 1555  TempSrc:   PainSc: 0-No pain                 Kyndahl Jablon

## 2018-12-04 ENCOUNTER — Encounter: Payer: Self-pay | Admitting: Gastroenterology

## 2018-12-05 LAB — SURGICAL PATHOLOGY

## 2018-12-06 ENCOUNTER — Encounter: Payer: Self-pay | Admitting: Urology

## 2018-12-06 ENCOUNTER — Ambulatory Visit: Payer: Federal, State, Local not specified - PPO | Admitting: Urology

## 2018-12-06 ENCOUNTER — Telehealth: Payer: Self-pay | Admitting: Urology

## 2018-12-06 VITALS — BP 135/76 | HR 67 | Ht 69.0 in | Wt 181.0 lb

## 2018-12-06 DIAGNOSIS — Z87442 Personal history of urinary calculi: Secondary | ICD-10-CM | POA: Diagnosis not present

## 2018-12-06 DIAGNOSIS — R109 Unspecified abdominal pain: Secondary | ICD-10-CM

## 2018-12-06 LAB — MICROSCOPIC EXAMINATION
Epithelial Cells (non renal): NONE SEEN /hpf (ref 0–10)
RBC, UA: NONE SEEN /hpf (ref 0–2)
WBC, UA: NONE SEEN /hpf (ref 0–5)

## 2018-12-06 LAB — URINALYSIS, COMPLETE
BILIRUBIN UA: NEGATIVE
Glucose, UA: NEGATIVE
Ketones, UA: NEGATIVE
Leukocytes, UA: NEGATIVE
Nitrite, UA: NEGATIVE
Protein, UA: NEGATIVE
RBC, UA: NEGATIVE
Specific Gravity, UA: 1.025 (ref 1.005–1.030)
Urobilinogen, Ur: 0.2 mg/dL (ref 0.2–1.0)
pH, UA: 6.5 (ref 5.0–7.5)

## 2018-12-06 NOTE — Telephone Encounter (Signed)
[employer] faxed.

## 2018-12-06 NOTE — Progress Notes (Signed)
12/06/2018 3:22 PM   Anthony Williamson 1961-03-18 [id number]  Referring provider: Idelle Crouch, MD [facility] [place], [place] [address]  Chief Complaint  Patient presents with  . Possible kidney stone    HPI: Patient is a 57 year old Caucasian male with a history of nephrolithiasis and BPH with LUTS who presents today for 1 year follow-up.  History of nephrolithiasis Patient underwent right ESWL for a 1.3 cm right renal stone on 02/27/2015.   Stone composition was found to be calcium slight monohydrate 45%, calcium oxalate dihydrate 35% and calcium phosphate at 5%.  Patient did not want to pursue a 24-hour urine analysis.  KUB taken on 03/03/2018 was negative for stones.      Patient states for the last 3 to 4 months he has been having a nagging right flank pain.  He describes it as a feeling of a "pulled muscle."  2 out of 10 pain.  He has not noted anything that makes the pain worse.  He has not noted anything that makes the pain better.  The pain does not radiate.  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  He states the radiologist told him he had 4 stones in his right kidney, but no hydronephrosis.  He has the CT disc with him today, but our CT ureters would not bring up the films.  Patient denies any gross hematuria, dysuria or suprapubic/flank pain.  Patient denies any fevers, chills, nausea or vomiting.   His UA is positive for amorphous sediment.     PMH: Past Medical History:  Diagnosis Date  . Allergic rhinitis   . Duodenitis   . Dysuria   . Family history of adverse reaction to anesthesia    father - slow to wake  . Gastritis   . HLD (hyperlipidemia)   . Hypercalcemia   . Inflammatory arthritis   . Low ferritin   . Low testosterone   . Mechanical back pain   . Right humeral fracture   . Right kidney stone     Surgical History: Past Surgical History:  Procedure Laterality Date    . COLONOSCOPY W/ POLYPECTOMY  02/28/2009  . COLONOSCOPY WITH PROPOFOL N/A 12/01/2018   Procedure: COLONOSCOPY WITH PROPOFOL;  Surgeon: Lollie Sails, MD;  Location: [facility] ENDOSCOPY;  Service: Endoscopy;  Laterality: N/A;  . ESOPHAGOGASTRODUODENOSCOPY  10/10/1998   Dr. Ara Kussmaul gastritis and duodenitis  . FLEXIBLE SIGMOIDOSCOPY  02/16/2002   Dr. Vira Agar  . HEMORRHOID SURGERY  04/17/2009   varices/thrombosis surgery Dr. Tamala Julian  . NASAL SINUS SURGERY    . SHOULDER ARTHROSCOPY Right 04/21/2016   Procedure: Limited arthroscopic debridement, arthroscopic subacromial decompression, and mini-open rotator cuff exploration, right shoulder.;  Surgeon: Corky Mull, MD;  Location: [facility];  Service: Orthopedics;  Laterality: Right;  . TONSILLECTOMY      Home Medications:  Allergies as of 12/06/2018      Reactions   Zithromax [azithromycin] Other (See Comments)   Unknown   Etodolac Other (See Comments)   Ranitidine Hcl Cough   And sore throat      Medication List        Accurate as of 12/06/18  3:22 PM. Always use your most recent med list.          bacitracin ointment Apply to affected area twice daily   bisoprolol-hydrochlorothiazide 5-6.25 MG tablet Commonly known as:  ZIAC Take by mouth.  butalbital-acetaminophen-caffeine 50-325-40 MG tablet Commonly known as:  FIORICET, ESGIC Take 2 tablets by mouth every 4 (four) hours as needed for headache.   cholecalciferol 1000 units tablet Commonly known as:  VITAMIN D Take 1,000 Units by mouth daily.   cloNIDine 0.1 mg/24hr patch Commonly known as:  CATAPRES - Dosed in mg/24 hr   hydrochlorothiazide 12.5 MG capsule Commonly known as:  MICROZIDE Take 12.5 mg by mouth daily.   hydrochlorothiazide 12.5 MG tablet Commonly known as:  HYDRODIURIL Take 12.5 mg by mouth daily.   HYDROcodone-acetaminophen 10-325 MG tablet Commonly known as:  NORCO Take 1 tablet by mouth every 6 (six) hours as needed.   Magnesium  Oxide 500 MG Tabs Take 1 tablet by mouth daily.   NAPROSYN PO Take 220 mg by mouth as needed.   sildenafil 20 MG tablet Commonly known as:  REVATIO 3-5 tablets once a day as needed       Allergies:  Allergies  Allergen Reactions  . Zithromax [Azithromycin] Other (See Comments)    Unknown  . Etodolac Other (See Comments)  . Ranitidine Hcl Cough    And sore throat    Family History: Family History  Problem Relation Age of Onset  . Coronary artery disease Unknown   . Prostate cancer Father   . Kidney disease Neg Hx   . Kidney cancer Neg Hx   . Bladder Cancer Neg Hx     Social History:  reports that he has never smoked. He has never used smokeless tobacco. He reports that he does not drink alcohol or use drugs.  ROS: UROLOGY Frequent Urination?: No Hard to postpone urination?: No Burning/pain with urination?: No Get up at night to urinate?: No Leakage of urine?: No Urine stream starts and stops?: No Trouble starting stream?: No Do you have to strain to urinate?: No Blood in urine?: No Urinary tract infection?: No Sexually transmitted disease?: No Injury to kidneys or bladder?: No Painful intercourse?: No Weak stream?: No Erection problems?: No Penile pain?: No  Gastrointestinal Nausea?: No Vomiting?: No Indigestion/heartburn?: No Diarrhea?: No Constipation?: No  Constitutional Fever: No Night sweats?: No Weight loss?: No Fatigue?: No  Skin Skin rash/lesions?: No Itching?: No  Eyes Blurred vision?: No Double vision?: No  Ears/Nose/Throat Sore throat?: No Sinus problems?: No  Hematologic/Lymphatic Swollen glands?: No Easy bruising?: No  Cardiovascular Leg swelling?: No Chest pain?: No  Respiratory Cough?: No Shortness of breath?: No  Endocrine Excessive thirst?: No  Musculoskeletal Back pain?: No Joint pain?: Yes  Neurological Headaches?: No Dizziness?: No  Psychologic Depression?: No Anxiety?: No  Physical Exam: BP  135/76   Pulse 67   Ht 5\' 9"  (1.753 m)   Wt 181 lb (82.1 kg)   BMI 26.73 kg/m   Constitutional: Well nourished. Alert and oriented, No acute distress. HEENT: [place] AT, moist mucus membranes. Trachea midline, no masses. Cardiovascular: No clubbing, cyanosis, or edema. Respiratory: Normal respiratory effort, no increased work of breathing. GI: Abdomen is soft, non tender, non distended, no abdominal masses.  GU: No CVA tenderness.  No bladder fullness or masses.   Skin: No rashes, bruises or suspicious lesions. Lymph: No inguinal adenopathy. Neurologic: Grossly intact, no focal deficits, moving all 4 extremities. Psychiatric: Normal mood and affect.   Laboratory Data: PSA history  0.78 ng/mL on 11/29/2014  0.67 ng/mL on 06/13/2015  0.61 ng/mL on 03/04/2017  0.64 ng/mL on 03/03/2018 I have reviewed the labs.   Assessment & Plan:    1. History of nephrolithiasis  -  Per patient, he had a CT which demonstrated right kidney stones without obstruction  -I did explain that for medical/legal reasons we would have to order an official CT scan in order to pursue any type of treatment for his presumed right kidney stones  -I did also caution that nonobstructing stones typically do not cause pain and to seek his primary care physician for other causes of his discomfort  -I did recommend that he have a 67-JQGB metabolic work-up at this time and he is agreeable  -He does have an appointment in March for an annual visit and we will move that appointment up to January and we will obtain a CT renal stone study at that time and discuss results  -Patient is advised that if they should start to experience pain that is not able to be controlled with pain medication, intractable nausea and/or vomiting and/or fevers greater than 103 or shaking chills to contact the office immediately or seek treatment in the emergency department for emergent intervention.    2. Right flank pain  -May be due to an ureteral  stone or muscle skeletal in nature, cannot be certain without an official CT scan and patient understands   Return for appointment in January .  These notes generated with voice recognition software. I apologize for typographical errors.  Zara Council, PA-C  [facility] [address] [place], [place] [address] [phone]

## 2018-12-06 NOTE — Patient Instructions (Signed)
[facility] Instructions [facility]  You will receive a box/kit in the mail that will have a urine jug and instructions in the kit.  When the box arrives you will need to call our office [phone] to schedule a LAB appointment.  You will need to do a 24hour urine and this should be done during the days that our office will be open.  For example any day from Sunday through Thursday.  How to collect the urine sample: On the day you start the urine sample this 1st morning urine should NOT be collected.  For the rest of the day including all night urines should be collected.  On the next morning the 1st urine should be collected and then you will be finished with the urine collections.  You will need to bring the box with you on your LAB appointment day after urine has been collected and all instructions are complete in the box.  Your blood will be drawn and the box will be collected by our Lab employee to be sent off for analysis.  When urine and blood is complete you will need to schedule a follow up appointment for lab results. 

## 2018-12-06 NOTE — Telephone Encounter (Signed)
Would you order a [facility] on this patient? 

## 2018-12-22 ENCOUNTER — Other Ambulatory Visit: Payer: Federal, State, Local not specified - PPO

## 2018-12-28 ENCOUNTER — Other Ambulatory Visit: Payer: Self-pay | Admitting: Urology

## 2019-01-05 ENCOUNTER — Ambulatory Visit
Admission: RE | Admit: 2019-01-05 | Discharge: 2019-01-05 | Disposition: A | Discharge status: Home / Self Care | Payer: Federal, State, Local not specified - PPO | Source: Ambulatory Visit | Attending: Urology | Admitting: Urology

## 2019-01-05 DIAGNOSIS — Z87442 Personal history of urinary calculi: Secondary | ICD-10-CM | POA: Diagnosis present

## 2019-01-05 DIAGNOSIS — R109 Unspecified abdominal pain: Secondary | ICD-10-CM | POA: Diagnosis present

## 2019-01-14 NOTE — Progress Notes (Signed)
01/15/2019 10:30 AM   Anthony Williamson 01/13/1961 [id number]  Referring provider: Idelle Crouch, MD [facility] [place], [place] [address]  Chief Complaint  Patient presents with  . Follow-up    24 hr urine results    HPI: Patient is a 58 year old Caucasian male with a history of nephrolithiasis and BPH with LUTS who presents today for 1 year follow-up with his wife, Anthony Williamson.    History of nephrolithiasis Patient underwent right ESWL for a 1.3 cm right renal stone on 02/27/2015.   Stone composition was found to be calcium slight monohydrate 45%, calcium oxalate dihydrate 35% and calcium phosphate at 5%.  Patient did not want to pursue a 24-hour urine analysis.  KUB taken on 03/03/2018 was negative for stones.    On 12/06/2018, patient stated for the last 3 to 4 months he has been having a nagging right flank pain.  He describes it as a feeling of a "pulled muscle."  2 out of 10 pain.  He has not noted anything that makes the pain worse.  He has not noted anything that makes the pain better.  The pain does not radiate.  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  He states the radiologist told him he had 4 stones in his right kidney, but no hydronephrosis.  He has the CT disc with him today, but our CT ureters would not bring up the films.  CT Renal stone study on 01/05/2019 revealed nonobstructing right renal stones. No ureteral or bladder stones.  No secondary changes in either kidney or ureter.  4 mm posterior right lower lobe pulmonary nodule. No follow-up needed if patient is low-risk. Non-contrast chest CT can be considered in 12 months if patient is high-risk. This recommendation follows the consensus statement: Guidelines for Management of Incidental Pulmonary Nodules Detected on CT Images: From the Fleischner Society 2017; Radiology 2017; 284:228-243.  Aortic Atherosclerois   [facility] results on 12/07/2018  revealed extreme hypercalciuria, borderline hyperoxaluria, high urine pH, mild CaOx stone risk and high CaP stone risk.    BPH WITH LUTS  (prostate and/or bladder) PVR: 0 mL     Major complaint(s):  Frequency, attributes this to increase in water intake.  Denies any dysuria, hematuria or suprapubic pain.   Denies any recent fevers, chills, nausea or vomiting.  He does not have a family history of PCa.    PMH: Past Medical History:  Diagnosis Date  . Allergic rhinitis   . Duodenitis   . Dysuria   . Family history of adverse reaction to anesthesia    father - slow to wake  . Gastritis   . HLD (hyperlipidemia)   . Hypercalcemia   . Inflammatory arthritis   . Low ferritin   . Low testosterone   . Mechanical back pain   . Right humeral fracture   . Right kidney stone     Surgical History: Past Surgical History:  Procedure Laterality Date  . COLONOSCOPY W/ POLYPECTOMY  02/28/2009  . COLONOSCOPY WITH PROPOFOL N/A 12/01/2018   Procedure: COLONOSCOPY WITH PROPOFOL;  Surgeon: Lollie Sails, MD;  Location: [facility] ENDOSCOPY;  Service: Endoscopy;  Laterality: N/A;  . ESOPHAGOGASTRODUODENOSCOPY  10/10/1998   Dr. Ara Kussmaul gastritis and duodenitis  . FLEXIBLE SIGMOIDOSCOPY  02/16/2002   Dr. Vira Agar  . HEMORRHOID SURGERY  04/17/2009   varices/thrombosis surgery Dr. Tamala Julian  . NASAL SINUS SURGERY    .  SHOULDER ARTHROSCOPY Right 04/21/2016   Procedure: Limited arthroscopic debridement, arthroscopic subacromial decompression, and mini-open rotator cuff exploration, right shoulder.;  Surgeon: Corky Mull, MD;  Location: [place];  Service: Orthopedics;  Laterality: Right;  . TONSILLECTOMY      Home Medications:  Allergies as of 01/15/2019      Reactions   Zithromax [azithromycin] Other (See Comments)   Unknown   Etodolac Other (See Comments)   Ranitidine Hcl Cough   And sore throat      Medication List       Accurate as of January 15, 2019 10:30 AM. Always use your  most recent med list.        bisoprolol-hydrochlorothiazide 5-6.25 MG tablet Commonly known as:  ZIAC Take by mouth.   butalbital-acetaminophen-caffeine 50-325-40 MG tablet Commonly known as:  FIORICET, ESGIC Take 2 tablets by mouth every 4 (four) hours as needed for headache.   cholecalciferol 1000 units tablet Commonly known as:  VITAMIN D Take 1,000 Units by mouth daily.   hydrochlorothiazide 12.5 MG tablet Commonly known as:  HYDRODIURIL Take 12.5 mg by mouth daily.   HYDROcodone-acetaminophen 10-325 MG tablet Commonly known as:  NORCO Take 1 tablet by mouth every 6 (six) hours as needed.   Magnesium Oxide 500 MG Tabs Take 1 tablet by mouth daily.   NAPROSYN PO Take 220 mg by mouth as needed.       Allergies:  Allergies  Allergen Reactions  . Zithromax [Azithromycin] Other (See Comments)    Unknown  . Etodolac Other (See Comments)  . Ranitidine Hcl Cough    And sore throat    Family History: Family History  Problem Relation Age of Onset  . Coronary artery disease Other   . Prostate cancer Father   . Kidney disease Neg Hx   . Kidney cancer Neg Hx   . Bladder Cancer Neg Hx     Social History:  reports that he has never smoked. He has never used smokeless tobacco. He reports that he does not drink alcohol or use drugs.  ROS: UROLOGY Frequent Urination?: Yes Hard to postpone urination?: No Burning/pain with urination?: No Get up at night to urinate?: No Leakage of urine?: No Urine stream starts and stops?: No Trouble starting stream?: No Do you have to strain to urinate?: No Blood in urine?: No Urinary tract infection?: No Sexually transmitted disease?: No Injury to kidneys or bladder?: No Painful intercourse?: No Weak stream?: No Erection problems?: No Penile pain?: No  Gastrointestinal Nausea?: No Vomiting?: No Indigestion/heartburn?: No Diarrhea?: No Constipation?: No  Constitutional Fever: No Night sweats?: No Weight loss?:  No Fatigue?: No  Skin Skin rash/lesions?: No Itching?: No  Eyes Blurred vision?: No Double vision?: No  Ears/Nose/Throat Sore throat?: No Sinus problems?: No  Hematologic/Lymphatic Swollen glands?: No Easy bruising?: No  Cardiovascular Leg swelling?: No Chest pain?: No  Respiratory Cough?: No Shortness of breath?: No  Endocrine Excessive thirst?: No  Musculoskeletal Back pain?: No Joint pain?: No  Neurological Headaches?: No Dizziness?: No  Psychologic Depression?: No Anxiety?: No  Physical Exam: BP (!) 146/85   Pulse (!) 50   Temp (!) 96.6 F (35.9 C) (Oral)   Ht 5\' 9"  (1.753 m)   Wt 183 lb 8 oz (83.2 kg)   BMI 27.10 kg/m   Constitutional:  Well nourished. Alert and oriented, No acute distress. HEENT: [place] AT, moist mucus membranes.  Trachea midline, no masses. Cardiovascular: No clubbing, cyanosis, or edema. Respiratory: Normal respiratory effort, no  increased work of breathing. GI: Abdomen is soft, non tender, non distended, no abdominal masses. Liver and spleen not palpable.  No hernias appreciated.  Stool sample for occult testing is not indicated.  Umbilical hernia present.  GU: No CVA tenderness.  No bladder fullness or masses.  Patient with circumcised phallus.   Urethral meatus is patent.  No penile discharge. No penile lesions or rashes. Scrotum without lesions, cysts, rashes and/or edema.  Testicles are located scrotally bilaterally. No masses are appreciated in the testicles. Left and right epididymis are normal. Rectal: Patient with  normal sphincter tone. Anus and perineum without scarring or rashes. No rectal masses are appreciated. Prostate is approximately 35 grams, no nodules are appreciated. Seminal vesicles are normal. Skin: No rashes, bruises or suspicious lesions. Lymph: No cervical or inguinal adenopathy. Neurologic: Grossly intact, no focal deficits, moving all 4 extremities. Psychiatric: Normal mood and affect.   Laboratory  Data: PSA history  0.78 ng/mL on 11/29/2014  0.67 ng/mL on 06/13/2015  0.61 ng/mL on 03/04/2017  0.64 ng/mL on 03/03/2018 I have reviewed the labs.  Pertinent Imaging CLINICAL DATA:  Intermittent right flank pain.  EXAM: CT ABDOMEN AND PELVIS WITHOUT CONTRAST  TECHNIQUE: Multidetector CT imaging of the abdomen and pelvis was performed following the standard protocol without IV contrast.  COMPARISON:  None.  FINDINGS: Lower chest: 4 mm nodule noted posterior right base (11/3).  Hepatobiliary: No focal abnormality in the liver on this study without intravenous contrast. There is no evidence for gallstones, gallbladder wall thickening, or pericholecystic fluid. No intrahepatic or extrahepatic biliary dilation.  Pancreas: No focal mass lesion. No dilatation of the main duct. No intraparenchymal cyst. No peripancreatic edema.  Spleen: No splenomegaly. No focal mass lesion.  Adrenals/Urinary Tract: No adrenal nodule or mass. A cluster of tiny stones identified in upper pole region of the right kidney. These 3 stones each measure 2-3 mm (images 86, 88/coronal series for). No left renal stones evident. No secondary changes in either kidney or ureter. No ureteral or bladder stones.  Stomach/Bowel: Stomach is nondistended. No gastric wall thickening. No evidence of outlet obstruction. Duodenum is normally positioned as is the ligament of Treitz. No small bowel wall thickening. No small bowel dilatation. The terminal ileum is normal. The appendix is normal. No gross colonic mass. No colonic wall thickening.  Vascular/Lymphatic: There is abdominal aortic atherosclerosis without aneurysm. There is no gastrohepatic or hepatoduodenal ligament lymphadenopathy. No intraperitoneal or retroperitoneal lymphadenopathy. No pelvic sidewall lymphadenopathy.  Reproductive: Prostate is unremarkable.  Other: No intraperitoneal free fluid.  Musculoskeletal: No worrisome lytic  or sclerotic osseous abnormality.  IMPRESSION: 1. Nonobstructing right renal stones. No ureteral or bladder stones. No secondary changes in either kidney or ureter. 2. 4 mm posterior right lower lobe pulmonary nodule. No follow-up needed if patient is low-risk. Non-contrast chest CT can be considered in 12 months if patient is high-risk. This recommendation follows the consensus statement: Guidelines for Management of Incidental Pulmonary Nodules Detected on CT Images: From the Fleischner Society 2017; Radiology 2017; 284:228-243. 3.  Aortic Atherosclerois (ICD10-170.0)   Electronically Signed   By: Misty Stanley M.D.   On: 01/05/2019 13:37 I have independently reviewed the films and note the right renal stones which appear to be contained in the parenchyma.     Results for THORVALD, ORSINO (MRN [id number]) as of 01/15/2019 10:29  Ref. Range 01/15/2019 10:23  Scan Result Unknown 87ml     Assessment & Plan:    1. Right renal stones  -  appeared contained in the parenchyma, not likely the cause of the right flank pain  - will check PTH, TSH, Vitamin D levels today  - discussed decreasing salt intake and protein intake in addition to the increase in water intake  - follow up pending labs   2. Right flank pain  - still intermittent   - not likely due to stones, follow up with PCP f  3. BPH with LUTS  - Continue conservative management, avoiding bladder irritants and timed voiding's  - Most bothersome symptoms is/are frequency secondarily to water intake   - PSA drawn today   - RTC in 12 months for IPSS, PSA and exam - if PSA is stable   4. Lung nodule  - gave patient and wife copy of the CT report  - explained that the recommendation is to have a repeat CT scan in one year if clinically appropriate and to discuss this with his PCP as these nodules may be cancerous    Return in about 1 year (around 01/16/2020) for IPSS, PSA and exam.  These notes generated with voice  recognition software. I apologize for typographical errors.  Zara Council, PA-C  [facility] [address] [place], [place] [address] [phone]

## 2019-01-15 ENCOUNTER — Encounter: Payer: Self-pay | Admitting: Urology

## 2019-01-15 ENCOUNTER — Other Ambulatory Visit: Payer: Self-pay

## 2019-01-15 ENCOUNTER — Ambulatory Visit: Payer: Federal, State, Local not specified - PPO | Admitting: Urology

## 2019-01-15 ENCOUNTER — Other Ambulatory Visit
Admission: RE | Admit: 2019-01-15 | Discharge: 2019-01-15 | Disposition: A | Discharge status: Home / Self Care | Payer: Federal, State, Local not specified - PPO | Attending: Urology | Admitting: Urology

## 2019-01-15 VITALS — BP 146/85 | HR 50 | Temp 96.6°F | Ht 69.0 in | Wt 183.5 lb

## 2019-01-15 DIAGNOSIS — N200 Calculus of kidney: Secondary | ICD-10-CM | POA: Diagnosis present

## 2019-01-15 DIAGNOSIS — N138 Other obstructive and reflux uropathy: Secondary | ICD-10-CM

## 2019-01-15 DIAGNOSIS — N401 Enlarged prostate with lower urinary tract symptoms: Secondary | ICD-10-CM | POA: Diagnosis present

## 2019-01-15 DIAGNOSIS — R109 Unspecified abdominal pain: Secondary | ICD-10-CM

## 2019-01-15 DIAGNOSIS — R911 Solitary pulmonary nodule: Secondary | ICD-10-CM

## 2019-01-15 LAB — BASIC METABOLIC PANEL
Anion gap: 9 (ref 5–15)
BUN: 17 mg/dL (ref 6–20)
CO2: 26 mmol/L (ref 22–32)
Calcium: 10.8 mg/dL — ABNORMAL HIGH (ref 8.9–10.3)
Chloride: 101 mmol/L (ref 98–111)
Creatinine, Ser: 0.8 mg/dL (ref 0.61–1.24)
GFR calc Af Amer: >60 mL/min (ref >60)
Glucose, Bld: 99 mg/dL (ref 70–99)
Potassium: 4 mmol/L (ref 3.5–5.1)
Sodium: 136 mmol/L (ref 135–145)

## 2019-01-15 LAB — BLADDER SCAN AMB NON-IMAGING

## 2019-01-15 LAB — PSA: PROSTATIC SPECIFIC ANTIGEN: 0.68 ng/mL (ref 0.00–4.00)

## 2019-01-15 LAB — TSH: TSH: 2.135 u[IU]/mL (ref 0.350–4.500)

## 2019-01-15 NOTE — Patient Instructions (Signed)
Dietary Guidelines to Help Prevent Kidney Stones Kidney stones are deposits of minerals and salts that form inside your kidneys. Your risk of developing kidney stones may be greater depending on your diet, your lifestyle, the medicines you take, and whether you have certain medical conditions. Most people can reduce their chances of developing kidney stones by following the instructions below. Depending on your overall health and the type of kidney stones you tend to develop, your dietitian may give you more specific instructions. What are tips for following this plan? Reading food labels  Choose foods with "no salt added" or "low-salt" labels. Limit your sodium intake to less than 1500 mg per day.  Choose foods with calcium for each meal and snack. Try to eat about 300 mg of calcium at each meal. Foods that contain 200-500 mg of calcium per serving include: ? 8 oz (237 ml) of milk, fortified nondairy milk, and fortified fruit juice. ? 8 oz (237 ml) of kefir, yogurt, and soy yogurt. ? 4 oz (118 ml) of tofu. ? 1 oz of cheese. ? 1 cup (300 g) of dried figs. ? 1 cup (91 g) of cooked broccoli. ? 1-3 oz can of sardines or mackerel.  Most people need 1000 to 1500 mg of calcium each day. Talk to your dietitian about how much calcium is recommended for you. Shopping  Buy plenty of fresh fruits and vegetables. Most people do not need to avoid fruits and vegetables, even if they contain nutrients that may contribute to kidney stones.  When shopping for convenience foods, choose: ? Whole pieces of fruit. ? Premade salads with dressing on the side. ? Low-fat fruit and yogurt smoothies.  Avoid buying frozen meals or prepared deli foods.  Look for foods with live cultures, such as yogurt and kefir. Cooking  Do not add salt to food when cooking. Place a salt shaker on the table and allow each person to add his or her own salt to taste.  Use vegetable protein, such as beans, textured vegetable  protein (TVP), or tofu instead of meat in pasta, casseroles, and soups. Meal planning   Eat less salt, if told by your dietitian. To do this: ? Avoid eating processed or premade food. ? Avoid eating fast food.  Eat less animal protein, including cheese, meat, poultry, or fish, if told by your dietitian. To do this: ? Limit the number of times you have meat, poultry, fish, or cheese each week. Eat a diet free of meat at least 2 days a week. ? Eat only one serving each day of meat, poultry, fish, or seafood. ? When you prepare animal protein, cut pieces into small portion sizes. For most meat and fish, one serving is about the size of one deck of cards.  Eat at least 5 servings of fresh fruits and vegetables each day. To do this: ? Keep fruits and vegetables on hand for snacks. ? Eat 1 piece of fruit or a handful of berries with breakfast. ? Have a salad and fruit at lunch. ? Have two kinds of vegetables at dinner.  Limit foods that are high in a substance called oxalate. These include: ? Spinach. ? Rhubarb. ? Beets. ? Potato chips and french fries. ? Nuts.  If you regularly take a diuretic medicine, make sure to eat at least 1-2 fruits or vegetables high in potassium each day. These include: ? Avocado. ? Banana. ? Orange, prune, carrot, or tomato juice. ? Baked potato. ? Cabbage. ? Beans and split   If you regularly take a diuretic medicine, make sure to eat at least 1-2 fruits or vegetables high in potassium each day. These include:  ? Avocado.  ? Banana.  ? Orange, prune, carrot, or tomato juice.  ? Baked potato.  ? Cabbage.  ? Beans and split peas.  General instructions     Drink enough fluid to keep your urine clear or pale yellow. This is the most important thing you can do.   Talk to your health care provider and dietitian about taking daily supplements. Depending on your health and the cause of your kidney stones, you may be advised:  ? Not to take supplements with vitamin C.  ? To take a calcium supplement.  ? To take a daily probiotic supplement.  ? To take other supplements such as magnesium, fish oil, or vitamin B6.   Take all medicines and supplements as told by your health care provider.   Limit alcohol intake to no  more than 1 drink a day for nonpregnant women and 2 drinks a day for men. One drink equals 12 oz of beer, 5 oz of wine, or 1 oz of hard liquor.   Lose weight if told by your health care provider. Work with your dietitian to find strategies and an eating plan that works best for you.  What foods are not recommended?  Limit your intake of the following foods, or as told by your dietitian. Talk to your dietitian about specific foods you should avoid based on the type of kidney stones and your overall health.  Grains  Breads. Bagels. Rolls. Baked goods. Salted crackers. Cereal. Pasta.  Vegetables  Spinach. Rhubarb. Beets. Canned vegetables. Pickles. Olives.  Meats and other protein foods  Nuts. Nut butters. Large portions of meat, poultry, or fish. Salted or cured meats. Deli meats. Hot dogs. Sausages.  Dairy  Cheese.  Beverages  Regular soft drinks. Regular vegetable juice.  Seasonings and other foods  Seasoning blends with salt. Salad dressings. Canned soups. Soy sauce. Ketchup. Barbecue sauce. Canned pasta sauce. Casseroles. Pizza. Lasagna. Frozen meals. Potato chips. French fries.  Summary   You can reduce your risk of kidney stones by making changes to your diet.   The most important thing you can do is drink enough fluid. You should drink enough fluid to keep your urine clear or pale yellow.   Ask your health care provider or dietitian how much protein from animal sources you should eat each day, and also how much salt and calcium you should have each day.  This information is not intended to replace advice given to you by your health care provider. Make sure you discuss any questions you have with your health care provider.  Document Released: 04/09/2011 Document Revised: 11/23/2016 Document Reviewed: 11/23/2016  Elsevier Interactive Patient Education  2019 [employer]

## 2019-01-17 LAB — PTH, INTACT AND CALCIUM
Calcium, Total (PTH): 10.9 mg/dL — ABNORMAL HIGH (ref 8.7–10.2)
PTH: 14 pg/mL — ABNORMAL LOW (ref 15–65)

## 2019-01-18 ENCOUNTER — Telehealth: Payer: Self-pay

## 2019-01-18 ENCOUNTER — Other Ambulatory Visit: Payer: Self-pay | Admitting: Urology

## 2019-01-18 NOTE — Telephone Encounter (Signed)
Left message for Dr Doy Hutching to let us know what Endocrinologist to refer to.

## 2019-01-18 NOTE — Progress Notes (Unsigned)
Orders in for referral to endocrinology.

## 2019-01-18 NOTE — Telephone Encounter (Signed)
-----   Message from Nori Riis, PA-C sent at 01/18/2019 12:14 PM EST ----- Would you please contact Dr. Stacie Glaze office to see who they refer to regarding endocrinology and that that office is within the [place] network?  Mr. Harvel would like to stay in the [place] network.

## 2019-01-19 LAB — VITAMIN D 1,25 DIHYDROXY
Vitamin D 1, 25 (OH)2 Total: 71 pg/mL — ABNORMAL HIGH
Vitamin D2 1, 25 (OH)2: <10 pg/mL
Vitamin D3 1, 25 (OH)2: 71 pg/mL

## 2019-01-22 ENCOUNTER — Telehealth: Payer: Self-pay

## 2019-01-22 NOTE — Telephone Encounter (Signed)
Dr. Doy Hutching office returned the call. They would recommend Dr. Lucilla Lame for endocrinology.  She is located at [facility] (office [phone]) and in the [place] system.

## 2019-01-22 NOTE — Telephone Encounter (Signed)
-----   Message from Nori Riis, PA-C sent at 01/18/2019 12:14 PM EST ----- Would you please contact Dr. Stacie Glaze office to see who they refer to regarding endocrinology and that that office is within the [place] network?  Mr. Heffern would like to stay in the [place] network.

## 2019-01-22 NOTE — Telephone Encounter (Signed)
Contacted Dr. Doy Hutching office a message was sent to see who the referral should be sent to. Their office will contact us for notification

## 2019-01-26 NOTE — Telephone Encounter (Signed)
His referral to Dr. Gabriel Carina has been ordered.

## 2019-02-06 ENCOUNTER — Other Ambulatory Visit (HOSPITAL_COMMUNITY): Payer: Self-pay | Admitting: Internal Medicine

## 2019-02-06 ENCOUNTER — Telehealth (HOSPITAL_COMMUNITY): Payer: Self-pay

## 2019-02-06 ENCOUNTER — Other Ambulatory Visit: Payer: Self-pay | Admitting: Internal Medicine

## 2019-02-06 DIAGNOSIS — R911 Solitary pulmonary nodule: Secondary | ICD-10-CM

## 2019-02-22 ENCOUNTER — Ambulatory Visit
Admission: RE | Admit: 2019-02-22 | Discharge: 2019-02-22 | Disposition: A | Discharge status: Home / Self Care | Payer: Federal, State, Local not specified - PPO | Source: Ambulatory Visit | Attending: Internal Medicine | Admitting: Internal Medicine

## 2019-02-22 DIAGNOSIS — R911 Solitary pulmonary nodule: Secondary | ICD-10-CM | POA: Insufficient documentation

## 2019-03-01 IMAGING — CR DG ABDOMEN 1V
2 series · 2 of 2 positions shown · non-contrast
Comparison: Abdominal x-ray dated March 04, 2017.

CLINICAL DATA: History of kidney stones.  Currently asymptomatic.

EXAM:
ABDOMEN - 1 VIEW

[abdomen kub (1 of 2)]
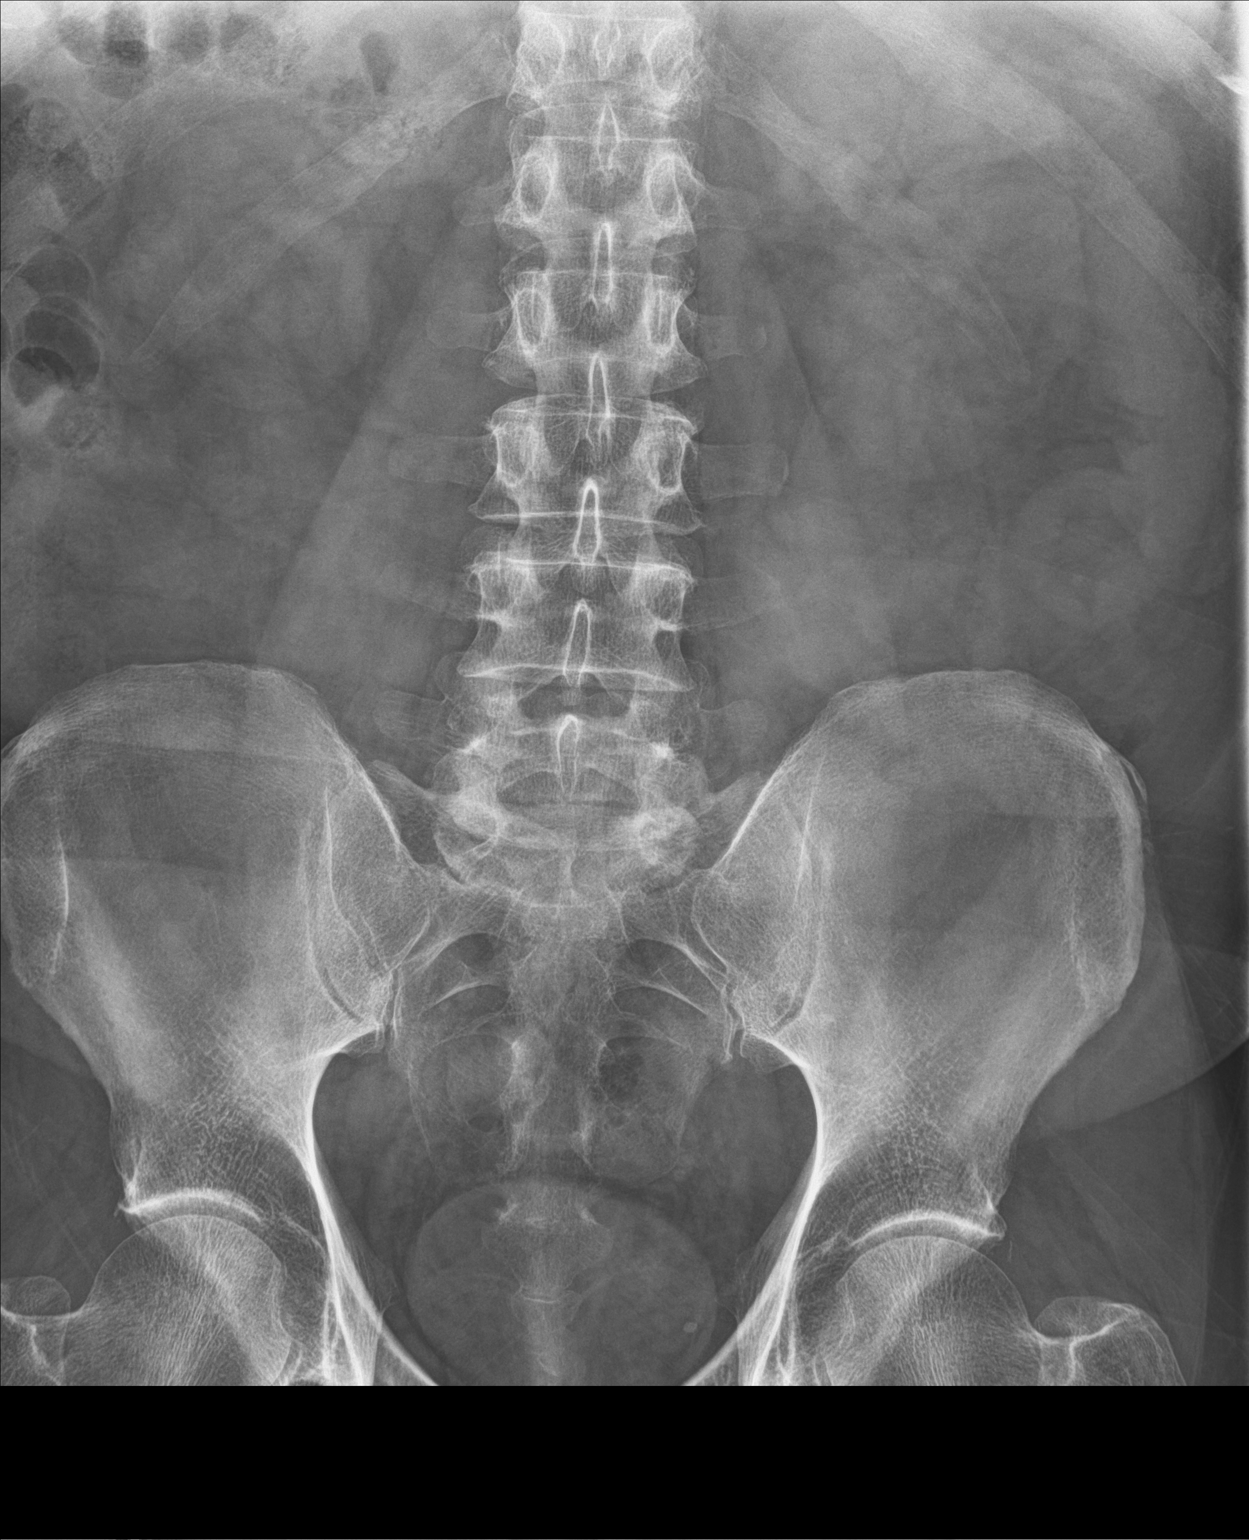

[abdomen kub (2 of 2)]
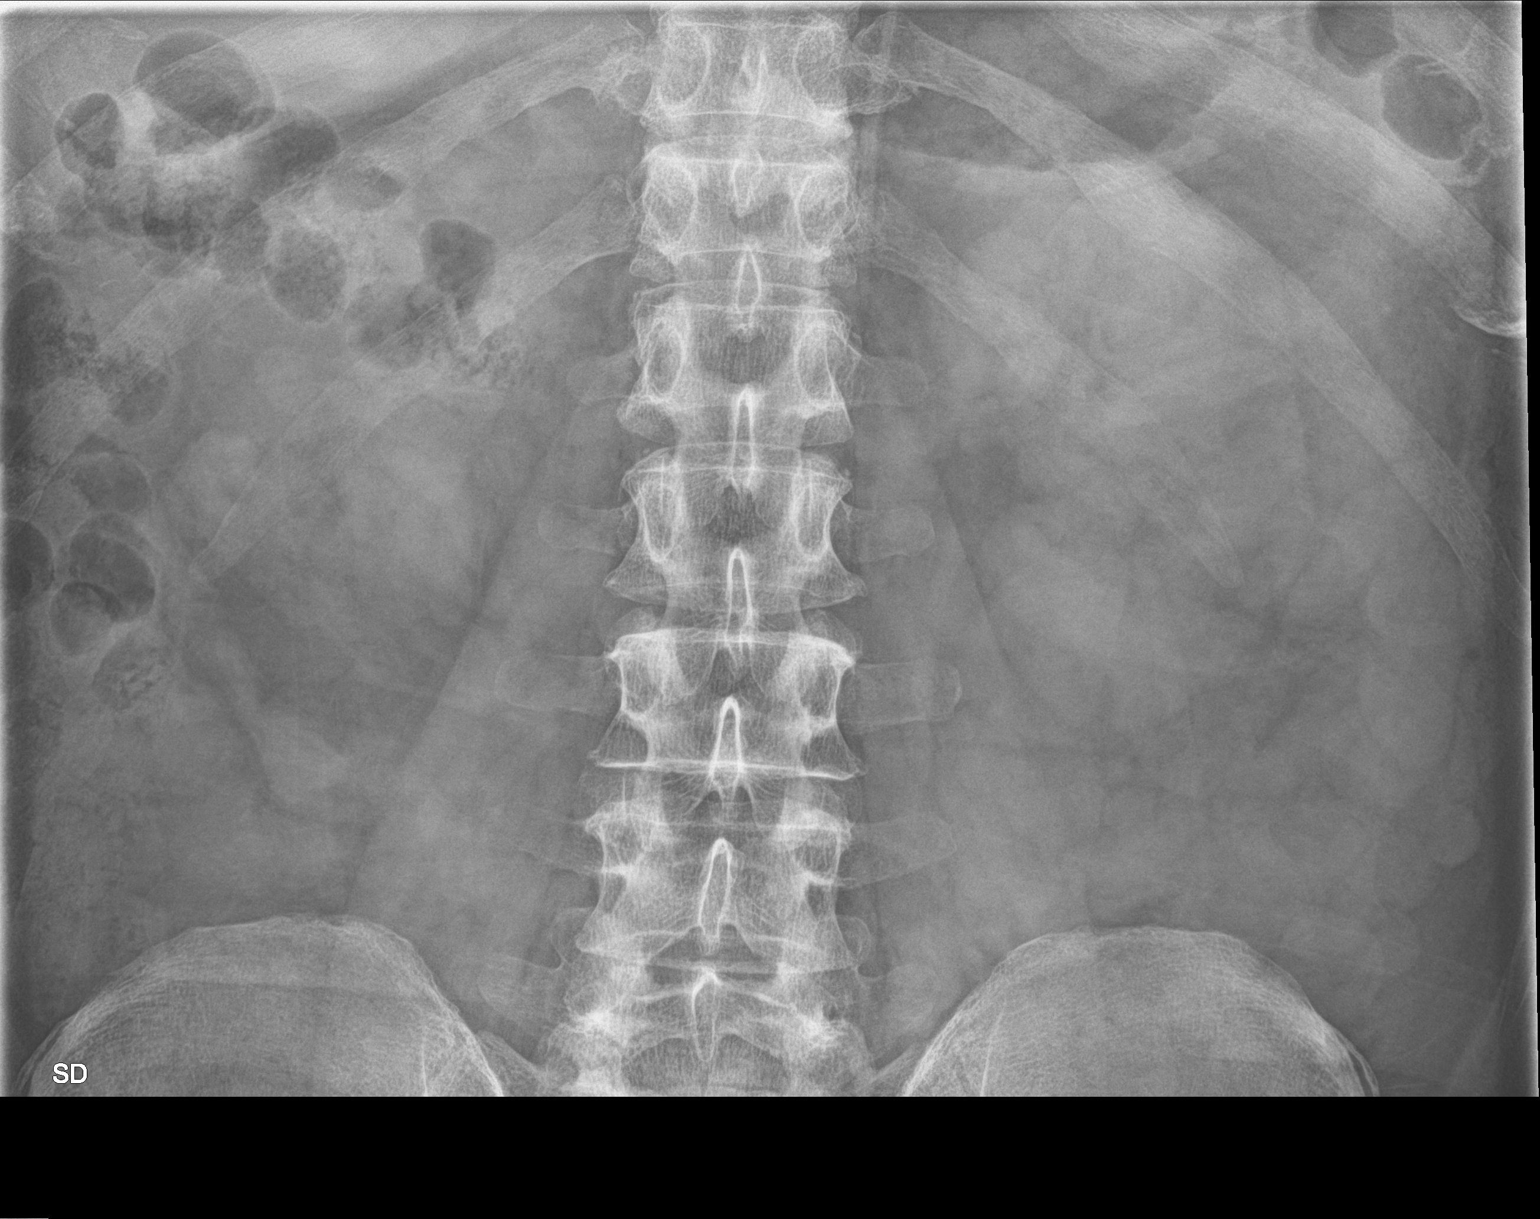

[2 of 2 positions shown; findings below may reference images not displayed]

FINDINGS: The bowel gas pattern is normal. No radio-opaque calculi or other
significant radiographic abnormality are seen.
IMPRESSION: Negative.

## 2019-03-15 ENCOUNTER — Ambulatory Visit: Payer: Federal, State, Local not specified - PPO | Admitting: Urology

## 2019-12-24 ENCOUNTER — Ambulatory Visit: Payer: Federal, State, Local not specified - PPO | Admitting: Urology

## 2020-01-02 NOTE — Progress Notes (Signed)
01/03/2020 9:27 AM   Anthony Williamson 06/29/1961 [id number]  Referring provider: Idelle Crouch, MD [place] [facility] [place],  [place] [address]  Chief Complaint  Patient presents with  . Nephrolithiasis    HPI: Patient is a 59 year old male with a history of nephrolithiasis and BPH with LUTS who presents today for 1 year follow-up.    History of nephrolithiasis Patient underwent right ESWL for a 1.3 cm right renal stone on 02/27/2015.   Stone composition was found to be calcium slight monohydrate 45%, calcium oxalate dihydrate 35% and calcium phosphate at 5%.  Patient did not want to pursue a 24-hour urine analysis.  KUB taken on 03/03/2018 was negative for stones.   CT Renal stone study on 01/05/2019 revealed nonobstructing right renal stones. No ureteral or bladder stones.  No secondary changes in either kidney or ureter.  4 mm posterior right lower lobe pulmonary nodule. No follow-up needed if patient is low-risk. Non-contrast chest CT can be considered in 12 months if patient is high-risk. This recommendation follows the consensus statement: Guidelines for Management of Incidental Pulmonary Nodules Detected on CT Images: From the Fleischner Society 2017; Radiology 2017; 284:228-243.  Aortic Atherosclerois    KUB 01/03/2020 no stones seen.    [facility] results on 12/07/2018 revealed extreme hypercalciuria, borderline hyperoxaluria, high urine pH, mild CaOx stone risk and high CaP stone risk.    BPH WITH LUTS  (prostate and/or bladder) IPSS score: 4/0       Major complaint(s):  No complaints.  Denies any dysuria, hematuria or suprapubic pain.   Denies any recent fevers, chills, nausea or vomiting.  His father has non-fatal prostate cancer.    IPSS    Row Name 01/03/20 0900         International Prostate Symptom Score   How often have you had the sensation of not emptying your bladder?  Less than 1 in 5     How often have you had to urinate less than  every two hours?  Less than 1 in 5 times     How often have you found you stopped and started again several times when you urinated?  Not at All     How often have you found it difficult to postpone urination?  Not at All     How often have you had a weak urinary stream?  Less than 1 in 5 times     How often have you had to strain to start urination?  Not at All     How many times did you typically get up at night to urinate?  1 Time     Total IPSS Score  4       Quality of Life due to urinary symptoms   If you were to spend the rest of your life with your urinary condition just the way it is now how would you feel about that?  Delighted        Score:  1-7 Mild 8-19 Moderate 20-35 Severe   PMH: Past Medical History:  Diagnosis Date  . Allergic rhinitis   . Duodenitis   . Dysuria   . Family history of adverse reaction to anesthesia    father - slow to wake  . Gastritis   . HLD (hyperlipidemia)   . Hypercalcemia   . Inflammatory arthritis   . Low ferritin   . Low testosterone   . Mechanical back pain   . Right humeral fracture   .  Right kidney stone     Surgical History: Past Surgical History:  Procedure Laterality Date  . COLONOSCOPY W/ POLYPECTOMY  02/28/2009  . COLONOSCOPY WITH PROPOFOL N/A 12/01/2018   Procedure: COLONOSCOPY WITH PROPOFOL;  Surgeon: Lollie Sails, MD;  Location: [facility] ENDOSCOPY;  Service: Endoscopy;  Laterality: N/A;  . ESOPHAGOGASTRODUODENOSCOPY  10/10/1998   Dr. Ara Kussmaul gastritis and duodenitis  . FLEXIBLE SIGMOIDOSCOPY  02/16/2002   Dr. Vira Agar  . HEMORRHOID SURGERY  04/17/2009   varices/thrombosis surgery Dr. Tamala Julian  . NASAL SINUS SURGERY    . SHOULDER ARTHROSCOPY Right 04/21/2016   Procedure: Limited arthroscopic debridement, arthroscopic subacromial decompression, and mini-open rotator cuff exploration, right shoulder.;  Surgeon: Corky Mull, MD;  Location: [facility];  Service: Orthopedics;  Laterality: Right;  . TONSILLECTOMY       Home Medications:  Allergies as of 01/03/2020      Reactions   Zithromax [azithromycin] Other (See Comments)   Unknown   Etodolac Other (See Comments)   Ranitidine Hcl Cough   And sore throat      Medication List       Accurate as of January 03, 2020  9:27 AM. If you have any questions, ask your nurse or doctor.        bisoprolol-hydrochlorothiazide 5-6.25 MG tablet Commonly known as: ZIAC Take by mouth.   butalbital-acetaminophen-caffeine 50-325-40 MG tablet Commonly known as: FIORICET Take 2 tablets by mouth every 4 (four) hours as needed for headache.   cholecalciferol 1000 units tablet Commonly known as: VITAMIN D Take 1,000 Units by mouth daily.   hydrochlorothiazide 12.5 MG tablet Commonly known as: HYDRODIURIL Take 12.5 mg by mouth daily.   HYDROcodone-acetaminophen 10-325 MG tablet Commonly known as: NORCO Take 1 tablet by mouth every 6 (six) hours as needed.   Magnesium Oxide 500 MG Tabs Take 1 tablet by mouth daily.   MENS 50+ MULTI VITAMIN/MIN PO Take by mouth.   NAPROSYN PO Take 220 mg by mouth as needed.       Allergies:  Allergies  Allergen Reactions  . Zithromax [Azithromycin] Other (See Comments)    Unknown  . Etodolac Other (See Comments)  . Ranitidine Hcl Cough    And sore throat    Family History: Family History  Problem Relation Age of Onset  . Coronary artery disease Other   . Prostate cancer Father   . Kidney disease Neg Hx   . Kidney cancer Neg Hx   . Bladder Cancer Neg Hx     Social History:  reports that he has never smoked. He has never used smokeless tobacco. He reports that he does not drink alcohol or use drugs.  ROS: UROLOGY Frequent Urination?: No Hard to postpone urination?: No Burning/pain with urination?: No Get up at night to urinate?: No Leakage of urine?: No Urine stream starts and stops?: No Trouble starting stream?: No Do you have to strain to urinate?: No Blood in urine?: No Urinary tract  infection?: No Sexually transmitted disease?: No Injury to kidneys or bladder?: No Painful intercourse?: No Weak stream?: No Erection problems?: No Penile pain?: No  Gastrointestinal Nausea?: No Vomiting?: No Indigestion/heartburn?: No Diarrhea?: No Constipation?: No  Constitutional Fever: No Night sweats?: No Weight loss?: No Fatigue?: No  Skin Skin rash/lesions?: No Itching?: No  Eyes Blurred vision?: No Double vision?: No  Ears/Nose/Throat Sore throat?: No Sinus problems?: No  Hematologic/Lymphatic Swollen glands?: No Easy bruising?: No  Cardiovascular Leg swelling?: No Chest pain?: No  Respiratory Cough?: No Shortness  of breath?: No  Endocrine Excessive thirst?: No  Musculoskeletal Back pain?: No Joint pain?: No  Neurological Headaches?: No Dizziness?: No  Psychologic Depression?: No Anxiety?: No  Physical Exam: BP (!) 153/87   Pulse 61   Ht 5\' 9"  (1.753 m)   Wt 182 lb (82.6 kg)   BMI 26.88 kg/m   Constitutional:  Well nourished. Alert and oriented, No acute distress. HEENT: [place] AT, mask in place.  Trachea midline, no masses. Cardiovascular: No clubbing, cyanosis, or edema. Respiratory: Normal respiratory effort, no increased work of breathing. GI: Abdomen is soft, non tender, non distended, no abdominal masses. Liver and spleen not palpable.  Umbilical hernia appreciated.  Stool sample for occult testing is not indicated.   GU: No CVA tenderness.  No bladder fullness or masses.  Patient with circumcised phallus.  Urethral meatus is patent.  No penile discharge. No penile lesions or rashes. Scrotum without lesions, cysts, rashes and/or edema.  Testicles are located scrotally bilaterally. No masses are appreciated in the testicles. Left and right epididymis are normal. Rectal: Patient with  normal sphincter tone. Anus and perineum without scarring or rashes. No rectal masses are appreciated. Prostate is approximately 45 grams, no nodules are  appreciated. Seminal vesicles are normal. Skin: No rashes, bruises or suspicious lesions. Lymph: No inguinal adenopathy. Neurologic: Grossly intact, no focal deficits, moving all 4 extremities. Psychiatric: Normal mood and affect.  Laboratory Data: PSA history  0.78 ng/mL on 11/29/2014  0.67 ng/mL on 06/13/2015  0.61 ng/mL on 03/04/2017  Component     Latest Ref Rng & Units 03/03/2018 01/15/2019  Prostatic Specific Antigen     0.00 - 4.00 ng/mL 0.64 0.68  0.52 ng/mL on 10/2019 I have reviewed the labs.  Pertinent Imaging CLINICAL DATA:  History of kidney stones, no current reported symptoms  EXAM: ABDOMEN - 1 VIEW  COMPARISON:  03/03/2018  FINDINGS: Moderate stool in the colon mainly in ascending and transverse colon.  Stool and gas overlie the renal contours bilaterally limiting assessment.  No renal calculi are seen.  Visualized skeletal structures are unremarkable.  IMPRESSION: 1. No plain film signs of renal calculi. 2. Moderate stool in the colon.   Electronically Signed   By: Zetta Bills M.D.   On: 01/03/2020 09:11 I have independently reviewed the films and do not appreciate any stones.     Assessment & Plan:    1. Right renal stones Not seen on KUB  2. BPH with LUTS IPSS score is 4/0 Continue conservative management, avoiding bladder irritants and timed voiding's RTC in 12 months for IPSS, PSA and exam   Return in about 1 year (around 01/02/2021) for I PSS, KUB and exam .  These notes generated with voice recognition software. I apologize for typographical errors.  Zara Council, PA-C  [facility] [address] [place], [place] [address] [phone]

## 2020-01-03 ENCOUNTER — Ambulatory Visit
Admission: RE | Admit: 2020-01-03 | Discharge: 2020-01-03 | Disposition: A | Discharge status: Home / Self Care | Payer: Federal, State, Local not specified - PPO | Source: Ambulatory Visit | Attending: Urology | Admitting: Urology

## 2020-01-03 ENCOUNTER — Encounter: Payer: Self-pay | Admitting: Urology

## 2020-01-03 ENCOUNTER — Other Ambulatory Visit: Payer: Self-pay | Admitting: Family Medicine

## 2020-01-03 ENCOUNTER — Ambulatory Visit: Payer: Federal, State, Local not specified - PPO | Admitting: Urology

## 2020-01-03 ENCOUNTER — Other Ambulatory Visit: Payer: Self-pay

## 2020-01-03 VITALS — BP 153/87 | HR 61 | Ht 69.0 in | Wt 182.0 lb

## 2020-01-03 DIAGNOSIS — N138 Other obstructive and reflux uropathy: Secondary | ICD-10-CM | POA: Diagnosis not present

## 2020-01-03 DIAGNOSIS — N200 Calculus of kidney: Secondary | ICD-10-CM

## 2020-01-03 DIAGNOSIS — N401 Enlarged prostate with lower urinary tract symptoms: Secondary | ICD-10-CM | POA: Diagnosis not present

## 2020-05-13 ENCOUNTER — Other Ambulatory Visit: Payer: Self-pay | Admitting: Internal Medicine

## 2020-05-13 DIAGNOSIS — R1011 Right upper quadrant pain: Secondary | ICD-10-CM

## 2020-05-19 ENCOUNTER — Ambulatory Visit
Admission: RE | Admit: 2020-05-19 | Discharge: 2020-05-19 | Disposition: A | Discharge status: Home / Self Care | Payer: Federal, State, Local not specified - PPO | Source: Ambulatory Visit | Attending: Internal Medicine | Admitting: Internal Medicine

## 2020-05-19 ENCOUNTER — Other Ambulatory Visit: Payer: Self-pay

## 2020-05-19 DIAGNOSIS — R1011 Right upper quadrant pain: Secondary | ICD-10-CM | POA: Diagnosis present

## 2020-07-12 ENCOUNTER — Other Ambulatory Visit: Payer: Self-pay

## 2020-07-12 ENCOUNTER — Emergency Department
Admission: EM | Admit: 2020-07-12 | Discharge: 2020-07-12 | Disposition: A | Discharge status: Home / Self Care | Payer: Federal, State, Local not specified - PPO | Attending: Emergency Medicine | Admitting: Emergency Medicine

## 2020-07-12 ENCOUNTER — Emergency Department: Payer: Federal, State, Local not specified - PPO

## 2020-07-12 DIAGNOSIS — R55 Syncope and collapse: Secondary | ICD-10-CM | POA: Diagnosis not present

## 2020-07-12 DIAGNOSIS — I10 Essential (primary) hypertension: Secondary | ICD-10-CM | POA: Diagnosis not present

## 2020-07-12 DIAGNOSIS — Z79899 Other long term (current) drug therapy: Secondary | ICD-10-CM | POA: Diagnosis not present

## 2020-07-12 LAB — URINALYSIS, COMPLETE (UACMP) WITH MICROSCOPIC
Bilirubin Urine: NEGATIVE
Glucose, UA: NEGATIVE mg/dL
Hgb urine dipstick: NEGATIVE
Ketones, ur: 5 mg/dL — AB
Leukocytes,Ua: NEGATIVE
Nitrite: NEGATIVE
Protein, ur: NEGATIVE mg/dL
Specific Gravity, Urine: 1.013 (ref 1.005–1.030)
Squamous Epithelial / HPF: NONE SEEN (ref 0–5)
pH: 8 (ref 5.0–8.0)

## 2020-07-12 LAB — MAGNESIUM: Magnesium: 1.9 mg/dL (ref 1.7–2.4)

## 2020-07-12 LAB — TROPONIN I (HIGH SENSITIVITY)
Troponin I (High Sensitivity): 5 ng/L (ref <18)
Troponin I (High Sensitivity): 13 ng/L (ref <18)
Troponin I (High Sensitivity): 14 ng/L (ref <18)

## 2020-07-12 LAB — COMPREHENSIVE METABOLIC PANEL
ALT: 32 U/L (ref 0–44)
AST: 33 U/L (ref 15–41)
Albumin: 4.2 g/dL (ref 3.5–5.0)
Alkaline Phosphatase: 48 U/L (ref 38–126)
Anion gap: 14 (ref 5–15)
BUN: 14 mg/dL (ref 6–20)
CO2: 21 mmol/L — ABNORMAL LOW (ref 22–32)
Calcium: 10.1 mg/dL (ref 8.9–10.3)
Chloride: 103 mmol/L (ref 98–111)
Creatinine, Ser: 1.22 mg/dL (ref 0.61–1.24)
GFR calc Af Amer: >60 mL/min (ref >60)
GFR calc non Af Amer: >60 mL/min (ref >60)
Glucose, Bld: 162 mg/dL — ABNORMAL HIGH (ref 70–99)
Potassium: 3.7 mmol/L (ref 3.5–5.1)
Sodium: 138 mmol/L (ref 135–145)
Total Bilirubin: 0.9 mg/dL (ref 0.3–1.2)
Total Protein: 7.4 g/dL (ref 6.5–8.1)

## 2020-07-12 LAB — CBC WITH DIFFERENTIAL/PLATELET
Abs Immature Granulocytes: 0.02 10*3/uL (ref 0.00–0.07)
Basophils Absolute: 0 10*3/uL (ref 0.0–0.1)
Basophils Relative: 0 %
Eosinophils Absolute: 0 10*3/uL (ref 0.0–0.5)
Eosinophils Relative: 1 %
HCT: 47 % (ref 39.0–52.0)
Hemoglobin: 16.5 g/dL (ref 13.0–17.0)
Immature Granulocytes: 0 %
Lymphocytes Relative: 28 %
Lymphs Abs: 1.8 10*3/uL (ref 0.7–4.0)
MCH: 30.6 pg (ref 26.0–34.0)
MCHC: 35.1 g/dL (ref 30.0–36.0)
MCV: 87 fL (ref 80.0–100.0)
Monocytes Absolute: 0.5 10*3/uL (ref 0.1–1.0)
Monocytes Relative: 8 %
Neutro Abs: 4.1 10*3/uL (ref 1.7–7.7)
Neutrophils Relative %: 63 %
Platelets: 256 10*3/uL (ref 150–400)
RBC: 5.4 MIL/uL (ref 4.22–5.81)
RDW: 13.7 % (ref 11.5–15.5)
WBC: 6.5 10*3/uL (ref 4.0–10.5)
nRBC: 0 % (ref 0.0–0.2)

## 2020-07-12 LAB — CK: Total CK: 108 U/L (ref 49–397)

## 2020-07-12 LAB — FIBRIN DERIVATIVES D-DIMER (ARMC ONLY): Fibrin derivatives D-dimer (ARMC): 335.75 ng/mL (FEU) (ref 0.00–499.00)

## 2020-07-12 MED ORDER — SODIUM CHLORIDE 0.9 % IV BOLUS
1000.0000 mL | Freq: Once | INTRAVENOUS | Status: AC
  Administered 2020-07-12: 1000 mL via INTRAVENOUS

## 2020-07-12 NOTE — ED Notes (Signed)
Pt wife at bedside.

## 2020-07-12 NOTE — ED Triage Notes (Addendum)
Pt here via [facility] from work. Pt has been working outside since 830 am, per son pt passed out/ diaphoretic. Pt reports diarrhea yesterday.  Per EMS pt diaphoretic on arrival, c/o cramping all over. Supine bp was 93/96, sitting systolic was 70.   Pt received 4mg  zofran in route/ 500 cc normal saline.   cbg 176, hr 80's.

## 2020-07-12 NOTE — ED Notes (Signed)
PT called out stating that he felt like he was going to pass out. RN in room, pt SpO2 sats noted to be 81% with good wave form. Pt was placed on 2 liter via [place]. Pts sats returned to normal. Pt tearful

## 2020-07-12 NOTE — Discharge Instructions (Signed)
We discussed admission versus going home for your passing out.  Your heart markers were slightly elevated but I suspect this is more secondary to some dehydration and your third marker was similar to your second marker.  However it is important that you call your cardiologist on Monday to get follow-up to discuss further work-up with him.  If you develop recurrent chest pain or passing out you need to return to the ER immediately for reevaluation

## 2020-07-12 NOTE — ED Notes (Signed)
Pt walked around room with no drop of O2 saturation. Maintained 97-100 percent.

## 2020-07-12 NOTE — ED Provider Notes (Signed)
[facility] Emergency Department Provider Note  ____________________________________________   First MD Initiated Contact with Patient 07/12/20 1002     (approximate)  I have reviewed the triage vital signs and the nursing notes.   HISTORY  Chief Complaint Loss of Consciousness    HPI Anthony Williamson is a 58 y.o. male with hypertension, hyperlipidemia who comes in for syncopal episode.  Patient not eat or drink anything this morning.  He was outside working since 8:30 AM.  He had been working in the heat for multiple hours.  Patient stated that he started to feel lightheaded and like he was going to pass out so he went and sat down.  The son witnessed him have brief LOC but does not sound like he hit his head.  He denies any headaches.  He did report a little bit of chest pain but that is since resolved.  He reports having intermittent chest pain now for 6 years.  States that he has been evaluated by cardiology for and had a negative stress test.  However the chest pain does get worse with exertion but given the negative stress test they have elected not to do catheterization.  He is followed by [facility] cardiology.  He has some pain in the left IV site radiating up into his arm but he states that he is had this previously when he had an IV before.  Initial blood pressure with EMS laying down was 98/64 and sitting systolic was 70.  Patient received 4 mg of Zofran and 500 cc of normal saline with EMS.  Glucose was normal at 176.  He denies any shortness of breath, abdominal pain, headaches, fevers or other concerns.  Syncopal episode x1, brought upon by not eating or drinking, better with fluids.  Moderate in nature           Past Medical History:  Diagnosis Date  . Allergic rhinitis   . Duodenitis   . Dysuria   . Family history of adverse reaction to anesthesia    father - slow to wake  . Gastritis   . HLD (hyperlipidemia)   . Hypercalcemia   . Inflammatory  arthritis   . Low ferritin   . Low testosterone   . Mechanical back pain   . Right humeral fracture   . Right kidney stone     Patient Active Problem List   Diagnosis Date Noted  . Allergic rhinitis 03/10/2018  . Gastritis and duodenitis 03/10/2018  . Hypercalcemia 03/10/2018  . Hyperlipidemia, unspecified 03/10/2018  . Inflammatory arthritis 03/10/2018  . Low ferritin 03/10/2018  . Lumbar sprain 03/10/2018  . Low back pain 03/10/2018  . Right humeral fracture 03/10/2018  . Motor tic disorder 01/27/2018  . Dyspnea on effort 04/13/2017  . Hypertension, essential 04/13/2017  . Vitamin D deficiency, unspecified 01/21/2017  . Bursitis of right shoulder 04/22/2016  . History of nephrolithiasis 03/17/2016  . 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 Low testosterone 08/29/2014    Past Surgical History:  Procedure Laterality Date  . COLONOSCOPY W/ POLYPECTOMY  02/28/2009  . COLONOSCOPY WITH PROPOFOL N/A 12/01/2018   Procedure: COLONOSCOPY WITH PROPOFOL;  Surgeon: Lollie Sails, MD;  Location: [facility] ENDOSCOPY;  Service: Endoscopy;  Laterality: N/A;  . ESOPHAGOGASTRODUODENOSCOPY  10/10/1998   Dr. Ara Kussmaul gastritis and duodenitis  . FLEXIBLE SIGMOIDOSCOPY  02/16/2002   Dr. Vira Agar  . HEMORRHOID SURGERY  04/17/2009   varices/thrombosis surgery Dr. Tamala Julian  . NASAL SINUS SURGERY    .  SHOULDER ARTHROSCOPY Right 04/21/2016   Procedure: Limited arthroscopic debridement, arthroscopic subacromial decompression, and mini-open rotator cuff exploration, right shoulder.;  Surgeon: Corky Mull, MD;  Location: [place];  Service: Orthopedics;  Laterality: Right;  . TONSILLECTOMY      Prior to Admission medications   Medication Sig Start Date End Date Taking? Authorizing Provider  bisoprolol-hydrochlorothiazide [facility]) 5-6.25 MG tablet Take by mouth. 01/21/17 01/21/18  [provider]  butalbital-acetaminophen-caffeine (FIORICET, ESGIC) 50-325-40 MG  tablet Take 2 tablets by mouth every 4 (four) hours as needed for headache.    [provider]  cholecalciferol (VITAMIN D) 1000 units tablet Take 1,000 Units by mouth daily.    [provider]  hydrochlorothiazide (HYDRODIURIL) 12.5 MG tablet Take 12.5 mg by mouth daily.    [provider]  HYDROcodone-acetaminophen (NORCO) 10-325 MG tablet Take 1 tablet by mouth every 6 (six) hours as needed.    [provider]  Magnesium Oxide 500 MG TABS Take 1 tablet by mouth daily.    [provider]  Multiple Vitamins-Minerals (MENS 50+ MULTI VITAMIN/MIN PO) Take by mouth.    [provider]  Naproxen (NAPROSYN PO) Take 220 mg by mouth as needed.    [provider]    Allergies Zithromax [azithromycin], Etodolac, and Ranitidine hcl  Family History  Problem Relation Age of Onset  . Coronary artery disease Other   . Prostate cancer Father   . Kidney disease Neg Hx   . Kidney cancer Neg Hx   . Bladder Cancer Neg Hx     Social History Social History   Tobacco Use  . Smoking status: Never Smoker  . Smokeless tobacco: Never Used  Substance Use Topics  . Alcohol use: No    Alcohol/week: 0.0 standard drinks    Comment: rare - 1x/6 mos  . Drug use: No      Review of Systems Constitutional: No fever/chills, passing out Eyes: No visual changes. ENT: No sore throat. Cardiovascular: Positive chest pain Respiratory: Denies shortness of breath. Gastrointestinal: No abdominal pain.  No nausea, no vomiting.  No diarrhea.  No constipation. Genitourinary: Negative for dysuria. Musculoskeletal: Negative for back pain.  Positive arm pain near IV site Skin: Negative for rash. Neurological: Negative for headaches, focal weakness or numbness.  Lightheadedness All other ROS negative ____________________________________________   PHYSICAL EXAM:  VITAL SIGNS: Blood pressure 116/75, pulse (!) 54, temperature 97.7 F (36.5 C), temperature  source Oral, resp. rate 20, height 5\' 9"  (1.753 m), weight 81.6 kg, SpO2 99 %.  Constitutional: Alert and oriented. Well appearing and in no acute distress. Eyes: Conjunctivae are normal. EOMI. Head: Atraumatic. Nose: No congestion/rhinnorhea. Mouth/Throat: Mucous membranes are moist.   Neck: No stridor. Trachea Midline. FROM Cardiovascular: Bradycardic, regular rhythm. Grossly normal heart sounds.  Good peripheral circulation. Respiratory: Normal respiratory effort.  No retractions. Lungs CTAB. Gastrointestinal: Soft and nontender. No distention. No abdominal bruits.  Musculoskeletal: No lower extremity tenderness nor edema.  No joint effusions.  No obvious swelling or discoloration to the left arm.  IV in place Neurologic:  Normal speech and language. No gross focal neurologic deficits are appreciated.  Skin:  Skin is warm, dry and intact. No rash noted. Psychiatric: Mood and affect are normal. Speech and behavior are normal. GU: Deferred   ____________________________________________   LABS (all labs ordered are listed, but only abnormal results are displayed)  Labs Reviewed  COMPREHENSIVE METABOLIC PANEL - Abnormal; Notable for the following components:  Result Value   CO2 21 (*)    Glucose, Bld 162 (*)    All other components within normal limits  URINALYSIS, COMPLETE (UACMP) WITH MICROSCOPIC - Abnormal; Notable for the following components:   Color, Urine YELLOW (*)    APPearance CLOUDY (*)    Ketones, ur 5 (*)    Bacteria, UA RARE (*)    All other components within normal limits  CBC WITH DIFFERENTIAL/PLATELET  CK  MAGNESIUM  FIBRIN DERIVATIVES D-DIMER ([facility] ONLY)  TROPONIN I (HIGH SENSITIVITY)  TROPONIN I (HIGH SENSITIVITY)  TROPONIN I (HIGH SENSITIVITY)  TROPONIN I (HIGH SENSITIVITY)   ____________________________________________   ED ECG REPORT I, Vanessa Lakehead, the attending physician, personally viewed and interpreted this ECG.  EKG is sinus bradycardia  rate of 58, type I AV block, ST elevation, no T wave inversions except for aVL ____________________________________________  RADIOLOGY Robert Bellow, personally viewed and evaluated these images (plain radiographs) as part of my medical decision making, as well as reviewing the written report by the radiologist.  ED MD interpretation:  No PNA   Official radiology report(s): DG Chest Portable 1 View  Result Date: 07/12/2020 CLINICAL DATA:  Shortness of breath EXAM: PORTABLE CHEST 1 VIEW COMPARISON:  02/22/2019 CT.  Chest radiograph 07/07/2008 FINDINGS: Midline trachea. Normal heart size. No pleural effusion or pneumothorax. Apparent pulmonary interstitial prominence is favored to be due to AP portable technique and relatively low lung volumes. No lobar consolidation. IMPRESSION: No acute cardiopulmonary disease. Electronically Signed   By: Abigail Miyamoto M.D.   On: 07/12/2020 11:25    ____________________________________________   PROCEDURES  Procedure(s) performed (including Critical Care):  .1-3 Lead EKG Interpretation Performed by: Vanessa [place], MD Authorized by: Vanessa [place]ute, MD     Interpretation: abnormal     ECG rate:  50s    ECG rate assessment: bradycardic     Rhythm: sinus bradycardia     Ectopy: none     Conduction: normal       ____________________________________________   INITIAL IMPRESSION / ASSESSMENT AND PLAN / ED COURSE  QUARTEZ LAGOS was evaluated in Emergency Department on 07/12/2020 for the symptoms described in the history of present illness. He was evaluated in the context of the global COVID-19 pandemic, which necessitated consideration that the patient might be at risk for infection with the SARS-CoV-2 virus that causes COVID-19. Institutional protocols and algorithms that pertain to the evaluation of patients at risk for COVID-19 are in a state of rapid change based on information released by regulatory bodies including the CDC and federal and state  organizations. These policies and algorithms were followed during the patient's care in the ED.     Patient is a 59 year old who comes in with syncopal episode after not eating or drinking all morning.  Suspect this is most likely related to dehydration.  Patient was orthostatic with EMS consistent with my concern that he was dehydrated.  Will get labs to evaluate for Electra abnormalities, AKI, rhabdo.  Patient did report a little bit of chest discomfort during this episode but he states he has had this previously and is never had any heart problems before.  Will get EKG and cardiac markers x2 to make sure no evidence of heart issues.  EKG without evidence of STEMI.  Will get EKG also evaluate for arrhythmia and keep patient on the cardiac monitor.  Denies shortness of breath to suggest PE.  Denies any abdominal pain to suggest AAA rupture.  Patient does report some pain in the left IV site.  We will hold off on using this IV for now.  No obvious infiltration.  No obvious swelling the pain started after the IV was placed  We will give 1 L of fluid for his low blood pressures.  Per nurse patient was desatting into the 3s.  Patient was dozed off and when she started talking to him the oxygen levels went back up without interventions.  However patient did appear slightly anxious.  Patient was placed on 2 L of oxygen due to his anxiousness.  And I ordered a chest x-ray and D-dimer to evaluate for PE labs otherwise have been reassuring.  No evidence of rhabdo.   Chest x-ray was negative.  D-dimer was negative.  Patient was taken off the oxygen and ambulate around the room and his oxygen levels were normal.  Not sure what to make of this individual low saturation but he denies any shortness of breath at all as I wonder if it was just a erroneous value.  His cardiac markers 5--> 13.  Discussed with patient that although the troponin is in the normal range that is trending upwards which are not a huge fan of.   Patient does report having some history of exertional chest pain but has been seen by cardiology at [facility] and they done a stress test back in March that was normal.  He states that he has had this similar pain for for 6 years now and he is not very concerned about it.  Discussed with patient that we have different options.  We could admit patient to the hospital here for cardiac marker trending, echo and discussion with cardiology.  We can repeat a third marker here and if it is continuing to go up talk to cardiology for possible admission versus discharge or we could discharge him without a marker but obviously this would be more risky.  Patient is willing to stay for a third cardiac marker.  He states that he gets his care at [facility] and he would like to be able to keep his care at [facility] so he would prefer to be able to go home and be admitted to our hospital.  Repeat cardiac marker was 14.  Given that this pericardial pressure was more stable we again discussed different options.  Patient states that he is feeling much better after the fluids and I suspect that this is more likely related to the dehydration.  He denies any recurrent chest pain and the chest pain that he had was the same chest pain that has been having for years.  He really does not want to come into our hospital and would rather follow-up with his new cardiologist that he is known for years.  He understands that he needs to call them on Monday to get a follow-up appointment to let them know what happened.  He also understands if he has recurrent chest pain that he would need to return to the ER immediately for repeat evaluation.  He understands there is a small risk of having a future heart attack at this time I have low suspicion that he had had a heart attack today given his reassuring cardiac markers.  Patient wife is at bedside and understands the benefits and risk and they feel comfortable at this time of going home and following up with her  cardiologist    ____________________________________________   FINAL CLINICAL IMPRESSION(S) / ED DIAGNOSES   Final diagnoses:  Syncope, unspecified syncope type      MEDICATIONS GIVEN DURING THIS VISIT:  Medications  sodium chloride 0.9 % bolus 1,000 mL (0 mLs Intravenous Stopped 07/12/20 1127)     ED Discharge Orders    None       Note:  This document was prepared using Dragon voice recognition software and may include unintentional dictation errors.   Vanessa Remington, MD 07/13/20 [phone]

## 2020-12-18 ENCOUNTER — Other Ambulatory Visit: Payer: Self-pay

## 2020-12-18 ENCOUNTER — Ambulatory Visit: Payer: Self-pay | Admitting: Surgery

## 2020-12-18 ENCOUNTER — Encounter
Admission: RE | Admit: 2020-12-18 | Discharge: 2020-12-18 | Disposition: A | Discharge status: Home / Self Care | Payer: Federal, State, Local not specified - PPO | Source: Ambulatory Visit | Attending: Surgery | Admitting: Surgery

## 2020-12-18 HISTORY — DX: Essential (primary) hypertension: I10

## 2020-12-18 NOTE — Patient Instructions (Signed)
Your procedure is scheduled on: 12/25/20 Report to [place]. You must stop at the Admitting Desk To find out your arrival time please call [phone] between 1PM - 3PM on 12/24/20.  Remember: Instructions that are not followed completely may result in serious medical risk, up to and including death, or upon the discretion of your surgeon and anesthesiologist your surgery may need to be rescheduled.     _X__ 1. Do not eat food after midnight the night before your procedure.                 No gum chewing or hard candies. You may drink clear liquids up to 2 hours                 before you are scheduled to arrive for your surgery- DO not drink clear                 liquids within 2 hours of the start of your surgery.                 Clear Liquids include:  water, apple juice without pulp, clear carbohydrate                 drink such as Clearfast or Gatorade, Black Coffee or Tea (Do not add                 anything to coffee or tea). Diabetics water only  __X__2.  On the morning of surgery brush your teeth with toothpaste and water, you                 may rinse your mouth with mouthwash if you wish.  Do not swallow any              toothpaste of mouthwash.     _X__ 3.  No Alcohol for 24 hours before or after surgery.   _X__ 4.  Do Not Smoke or use e-cigarettes For 24 Hours Prior to Your Surgery.                 Do not use any chewable tobacco products for at least 6 hours prior to                 surgery.  ____  5.  Bring all medications with you on the day of surgery if instructed.   __X__  6.  Notify your doctor if there is any change in your medical condition      (cold, fever, infections).     Do not wear jewelry, make-up, hairpins, clips or nail polish. Do not wear lotions, powders, or perfumes.  Do not shave 48 hours prior to surgery. Men may shave face and neck. Do not bring valuables to the hospital.    [facility] is  not responsible for any belongings or valuables.  Contacts, dentures/partials or body piercings may not be worn into surgery. Bring a case for your contacts, glasses or hearing aids, a denture cup will be supplied. Leave your suitcase in the car. After surgery it may be brought to your room. For patients admitted to the hospital, discharge time is determined by your treatment team.   Patients discharged the day of surgery will not be allowed to drive home.   Please read over the following fact sheets that you were given:   MRSA Information  __X__ Take these medicines the morning of surgery  with A SIP OF WATER:    1. amLODipine (NORVASC) 5 MG tablet  2. rosuvastatin (CRESTOR) 5 MG tablet  3. HYDROcodone-acetaminophen (NORCO) 10-325 MG tablet if needed  4.  5.  6.  ____ Fleet Enema (as directed)   __X__ Use CHG Soap/SAGE wipes as directed  ____ Use inhalers on the day of surgery  ____ Stop metformin/Janumet/Farxiga 2 days prior to surgery    ____ Take 1/2 of usual insulin dose the night before surgery. No insulin the morning          of surgery.   ____ Stop Blood Thinners Coumadin/Plavix/Xarelto/Pleta/Pradaxa/Eliquis/Effient/Aspirin  on   Or contact your Surgeon, Cardiologist or Medical Doctor regarding  ability to stop your blood thinners  __X__ Stop Anti-inflammatories 7 days before surgery such as Advil, Ibuprofen, Motrin,  BC or Goodies Powder, Naprosyn, Naproxen, Aleve, Aspirin    __X__ Stop all herbal supplements, fish oil or vitamin E until after surgery.    ____ Bring C-Pap to the hospital.

## 2020-12-18 NOTE — H&P (View-Only) (Signed)
Subjective:   CC: Umbilical hernia without obstruction and without gangrene [K42.9]  HPI: Anthony Williamson is a 59 y.o. male who was referred by Jeffrey D Sparks, MD for evaluation of above. Symptoms were first noted several years ago, recently increasing in size. Asymptomatic for now.  Past Medical History: has a past medical history of Allergic rhinitis, Gastritis and duodenitis, Hypercalcemia, Hyperlipidemia, Hypertension, Inflammatory arthritis, Kidney stones, Low ferritin, Mechanical back pain, and Right humeral fracture.  Past Surgical History:  Past Surgical History:  Procedure Laterality Date  . COLONOSCOPY 02/28/2009  Dr. M. Skulskie @ [facility] - benign polyp w/hemorrh. varices/thrombosis - surgery 04/17/2009 Dr. W. Smith  . COLONOSCOPY 12/01/2018  Hyperplastic changes/Repeat 10yrs/MUS  . EGD 10/10/1998  Dr. S. Bindrim @ [facility] - gastritis, duodenitis  . FLEXIBLE SIGMOIDOSCOPY 02/16/2002  Dr. R. Elliott @ [facility] - Int. Hemorrrh.  . limited arthroscopic debridement, arthroscopic subacromial decompression, and mini-open rotator cuff exploration, right shoulder Right 04/21/2016  Dr.Poggi  . TONSILLECTOMY  . UPPER GASTROINTESTINAL ENDOSCOPY   Family History: family history includes Cancer in his father and mother; Clotting disorder in his father and mother; Coronary Artery Disease (Blocked arteries around heart) in his father and another family member; Dementia in his mother; High blood pressure (Hypertension) in his father and mother; Myocardial Infarction (Heart attack) in his father; Stroke in his father; Tremor in his mother.  Social History: reports that he has never smoked. He has never used smokeless tobacco. He reports current alcohol use. He reports that he does not use drugs.  Current Medications: has a current medication list which includes the following prescription(s): amlodipine, butalbital-acetaminophen-caffeine, hydrocodone-acetaminophen, ibuprofen, naproxen sodium, dhea,  pyridoxine (vitamin b6), and rosuvastatin.  Allergies:  Allergies as of 08/12/2020 - Reviewed 08/12/2020  Allergen Reaction Noted  . Etodolac Other (See Comments) 08/28/2014  . Zantac [ranitidine hcl] Unknown 08/28/2014  . Zithromax [azithromycin] Unknown 08/28/2014   ROS:  A 15 point review of systems was performed and pertinent positives and negatives noted in HPI  Objective:    BP 130/76  Pulse 63  Ht 175.3 cm (5' 9")  Wt 85.3 kg (188 lb)  BMI 27.76 kg/m   Constitutional : alert, appears stated age, cooperative and no distress  Lymphatics/Throat: no asymmetry, masses, or scars  Respiratory: clear to auscultation bilaterally  Cardiovascular: regular rate and rhythm  Gastrointestinal: soft, non-tender; bowel sounds normal; no masses, no organomegaly. umbilical hernia noted. small, reducible, no overlying skin changes and defect measuring around 7mm in diameter  Musculoskeletal: Steady gait and movement  Skin: Cool and moist  Psychiatric: Normal affect, non-agitated, not confused    LABS:  n/a   RADS: n/a Assessment:    Umbilical hernia without obstruction and without gangrene [K42.9]  Plan:    1. Umbilical hernia without obstruction and without gangrene [K42.9]  Discussed the risk of surgery including recurrence, which can be up to 50% in the case of incisional or complex hernias, possible use of prosthetic materials (mesh) and the increased risk of mesh infxn if used, bleeding, chronic pain, post-op infxn, post-op SBO or ileus, and possible re-operation to address said risks. The risks of general anesthetic, if used, includes MI, CVA, sudden death or even reaction to anesthetic medications also discussed. Alternatives include continued observation. Benefits include possible symptom relief, prevention of incarceration, strangulation, enlargement in size over time, and the risk of emergency surgery in the face of strangulation.   Typical post-op recovery time of 3-5  days with 2 weeks of activity restrictions   were also discussed.  ED return precautions given for sudden increase in pain, size of hernia with accompanying fever, nausea, and/or vomiting.  The patient verbalized understanding and all questions were answered to the patient's satisfaction.  2. Patient has elected to proceed with surgical treatment. Procedure will be scheduled. Written consent was obtained. Open due to small size of defect.

## 2020-12-18 NOTE — H&P (Signed)
Subjective:   CC: Umbilical hernia without obstruction and without gangrene [K42.9]  HPI: Anthony Williamson is a 59 y.o. male who was referred by Marguarite Arbour, MD for evaluation of above. Symptoms were first noted several years ago, recently increasing in size. Asymptomatic for now.  Past Medical History: has a past medical history of Allergic rhinitis, Gastritis and duodenitis, Hypercalcemia, Hyperlipidemia, Hypertension, Inflammatory arthritis, Kidney stones, Low ferritin, Mechanical back pain, and Right humeral fracture.  Past Surgical History:  Past Surgical History:  Procedure Laterality Date  . COLONOSCOPY 02/28/2009  Dr. Eber Hong @ [facility] - benign polyp w/hemorrh. varices/thrombosis - surgery 04/17/2009 Dr. Malissa Hippo  . COLONOSCOPY 12/01/2018  Hyperplastic changes/Repeat 66yrs/MUS  . EGD 10/10/1998  Dr. Kathie Rhodes. [place] @ [facility] - gastritis, duodenitis  . FLEXIBLE SIGMOIDOSCOPY 02/16/2002  Dr. Maggie Font @ [facility] - Int. Hemorrrh.  . limited arthroscopic debridement, arthroscopic subacromial decompression, and mini-open rotator cuff exploration, right shoulder Right 04/21/2016  Dr.Poggi  . TONSILLECTOMY  . UPPER GASTROINTESTINAL ENDOSCOPY   Family History: family history includes Cancer in his father and mother; Clotting disorder in his father and mother; Coronary Artery Disease (Blocked arteries around heart) in his father and another family member; Dementia in his mother; High blood pressure (Hypertension) in his father and mother; Myocardial Infarction (Heart attack) in his father; Stroke in his father; Tremor in his mother.  Social History: reports that he has never smoked. He has never used smokeless tobacco. He reports current alcohol use. He reports that he does not use drugs.  Current Medications: has a current medication list which includes the following prescription(s): amlodipine, butalbital-acetaminophen-caffeine, hydrocodone-acetaminophen, ibuprofen, naproxen sodium, dhea,  pyridoxine (vitamin b6), and rosuvastatin.  Allergies:  Allergies as of 08/12/2020 - Reviewed 08/12/2020  Allergen Reaction Noted  . Etodolac Other (See Comments) 08/28/2014  . Zantac [ranitidine hcl] Unknown 08/28/2014  . Zithromax [azithromycin] Unknown 08/28/2014   ROS:  A 15 point review of systems was performed and pertinent positives and negatives noted in HPI  Objective:    BP 130/76  Pulse 63  Ht 175.3 cm (5\' 9" )  Wt 85.3 kg (188 lb)  BMI 27.76 kg/m   Constitutional : alert, appears stated age, cooperative and no distress  Lymphatics/Throat: no asymmetry, masses, or scars  Respiratory: clear to auscultation bilaterally  Cardiovascular: regular rate and rhythm  Gastrointestinal: soft, non-tender; bowel sounds normal; no masses, no organomegaly. umbilical hernia noted. small, reducible, no overlying skin changes and defect measuring around 75mm in diameter  Musculoskeletal: Steady gait and movement  Skin: Cool and moist  Psychiatric: Normal affect, non-agitated, not confused    LABS:  n/a   RADS: n/a Assessment:    Umbilical hernia without obstruction and without gangrene [K42.9]  Plan:    1. Umbilical hernia without obstruction and without gangrene [K42.9]  Discussed the risk of surgery including recurrence, which can be up to 50% in the case of incisional or complex hernias, possible use of prosthetic materials (mesh) and the increased risk of mesh infxn if used, bleeding, chronic pain, post-op infxn, post-op SBO or ileus, and possible re-operation to address said risks. The risks of general anesthetic, if used, includes MI, CVA, sudden death or even reaction to anesthetic medications also discussed. Alternatives include continued observation. Benefits include possible symptom relief, prevention of incarceration, strangulation, enlargement in size over time, and the risk of emergency surgery in the face of strangulation.   Typical post-op recovery time of 3-5  days with 2 weeks of activity restrictions  were also discussed.  ED return precautions given for sudden increase in pain, size of hernia with accompanying fever, nausea, and/or vomiting.  The patient verbalized understanding and all questions were answered to the patient's satisfaction.  2. Patient has elected to proceed with surgical treatment. Procedure will be scheduled. Written consent was obtained. Open due to small size of defect.

## 2020-12-23 ENCOUNTER — Other Ambulatory Visit
Admission: RE | Admit: 2020-12-23 | Discharge: 2020-12-23 | Disposition: A | Discharge status: Home / Self Care | Payer: Federal, State, Local not specified - PPO | Source: Ambulatory Visit | Attending: Surgery | Admitting: Surgery

## 2020-12-23 ENCOUNTER — Other Ambulatory Visit: Payer: Self-pay | Admitting: Radiology

## 2020-12-23 ENCOUNTER — Other Ambulatory Visit: Payer: Self-pay

## 2020-12-23 DIAGNOSIS — Z888 Allergy status to other drugs, medicaments and biological substances status: Secondary | ICD-10-CM | POA: Diagnosis not present

## 2020-12-23 DIAGNOSIS — K429 Umbilical hernia without obstruction or gangrene: Secondary | ICD-10-CM | POA: Diagnosis not present

## 2020-12-23 DIAGNOSIS — Z01812 Encounter for preprocedural laboratory examination: Secondary | ICD-10-CM | POA: Insufficient documentation

## 2020-12-23 DIAGNOSIS — Z791 Long term (current) use of non-steroidal anti-inflammatories (NSAID): Secondary | ICD-10-CM | POA: Diagnosis not present

## 2020-12-23 DIAGNOSIS — Z79899 Other long term (current) drug therapy: Secondary | ICD-10-CM | POA: Diagnosis not present

## 2020-12-23 DIAGNOSIS — Z20822 Contact with and (suspected) exposure to covid-19: Secondary | ICD-10-CM | POA: Insufficient documentation

## 2020-12-23 DIAGNOSIS — N200 Calculus of kidney: Secondary | ICD-10-CM

## 2020-12-23 DIAGNOSIS — Z881 Allergy status to other antibiotic agents status: Secondary | ICD-10-CM | POA: Diagnosis not present

## 2020-12-23 LAB — SARS CORONAVIRUS 2 (TAT 6-24 HRS): SARS Coronavirus 2: NEGATIVE

## 2020-12-25 ENCOUNTER — Ambulatory Visit: Payer: Federal, State, Local not specified - PPO | Admitting: Urgent Care

## 2020-12-25 ENCOUNTER — Encounter
Admission: RE | Disposition: A | Discharge status: Home / Self Care | Payer: Self-pay | Source: Home / Self Care | Attending: Surgery

## 2020-12-25 ENCOUNTER — Other Ambulatory Visit: Payer: Self-pay

## 2020-12-25 ENCOUNTER — Encounter: Payer: Self-pay | Admitting: Surgery

## 2020-12-25 ENCOUNTER — Ambulatory Visit
Admission: RE | Admit: 2020-12-25 | Discharge: 2020-12-25 | Disposition: A | Discharge status: Home / Self Care | Payer: Federal, State, Local not specified - PPO | Attending: Surgery | Admitting: Surgery

## 2020-12-25 DIAGNOSIS — Z20822 Contact with and (suspected) exposure to covid-19: Secondary | ICD-10-CM | POA: Insufficient documentation

## 2020-12-25 DIAGNOSIS — Z791 Long term (current) use of non-steroidal anti-inflammatories (NSAID): Secondary | ICD-10-CM | POA: Insufficient documentation

## 2020-12-25 DIAGNOSIS — K429 Umbilical hernia without obstruction or gangrene: Secondary | ICD-10-CM | POA: Diagnosis not present

## 2020-12-25 DIAGNOSIS — Z888 Allergy status to other drugs, medicaments and biological substances status: Secondary | ICD-10-CM | POA: Insufficient documentation

## 2020-12-25 DIAGNOSIS — Z881 Allergy status to other antibiotic agents status: Secondary | ICD-10-CM | POA: Insufficient documentation

## 2020-12-25 DIAGNOSIS — Z79899 Other long term (current) drug therapy: Secondary | ICD-10-CM | POA: Insufficient documentation

## 2020-12-25 HISTORY — PX: UMBILICAL HERNIA REPAIR: SHX196

## 2020-12-25 SURGERY — REPAIR, HERNIA, UMBILICAL, ADULT
Anesthesia: General

## 2020-12-25 MED ORDER — ORAL CARE MOUTH RINSE: 15.0000 mL | Freq: Once | OROMUCOSAL | Status: AC

## 2020-12-25 MED ORDER — FENTANYL CITRATE (PF) 100 MCG/2ML IJ SOLN
INTRAMUSCULAR | Status: AC
  Filled 2020-12-25: qty 2

## 2020-12-25 MED ORDER — BUPIVACAINE-EPINEPHRINE (PF) 0.5% -1:200000 IJ SOLN
INTRAMUSCULAR | Status: AC
  Filled 2020-12-25: qty 30

## 2020-12-25 MED ORDER — BUPIVACAINE-EPINEPHRINE (PF) 0.5% -1:200000 IJ SOLN
INTRAMUSCULAR | Status: DC | PRN
  Administered 2020-12-25: 6 mL

## 2020-12-25 MED ORDER — FENTANYL CITRATE (PF) 100 MCG/2ML IJ SOLN
25.0000 ug | INTRAMUSCULAR | Status: DC | PRN
  Administered 2020-12-25 (×3): 25 ug via INTRAVENOUS

## 2020-12-25 MED ORDER — PROPOFOL 10 MG/ML IV BOLUS
INTRAVENOUS | Status: AC
  Filled 2020-12-25: qty 20

## 2020-12-25 MED ORDER — DOCUSATE SODIUM 100 MG PO CAPS: 100.0000 mg | ORAL_CAPSULE | Freq: Two times a day (BID) | ORAL | 0 refills | Status: AC | PRN

## 2020-12-25 MED ORDER — OXYCODONE HCL 5 MG PO TABS
5.0000 mg | ORAL_TABLET | Freq: Once | ORAL | Status: AC | PRN
Start: 2020-12-25 — End: 2020-12-25
  Administered 2020-12-25: 5 mg via ORAL

## 2020-12-25 MED ORDER — DEXAMETHASONE SODIUM PHOSPHATE 10 MG/ML IJ SOLN
INTRAMUSCULAR | Status: AC
  Filled 2020-12-25: qty 1

## 2020-12-25 MED ORDER — BUPIVACAINE LIPOSOME 1.3 % IJ SUSP
INTRAMUSCULAR | Status: AC
  Filled 2020-12-25: qty 20

## 2020-12-25 MED ORDER — CHLORHEXIDINE GLUCONATE 0.12 % MT SOLN: 15.0000 mL | Freq: Once | OROMUCOSAL | Status: AC

## 2020-12-25 MED ORDER — BUPIVACAINE LIPOSOME 1.3 % IJ SUSP
INTRAMUSCULAR | Status: DC | PRN
  Administered 2020-12-25: 20 mL

## 2020-12-25 MED ORDER — ACETAMINOPHEN 500 MG PO TABS
ORAL_TABLET | ORAL | Status: AC
  Administered 2020-12-25: 14:00:00 1000 mg via ORAL
  Filled 2020-12-25: qty 2

## 2020-12-25 MED ORDER — HYDROCODONE-ACETAMINOPHEN 5-325 MG PO TABS: 1.0000 | ORAL_TABLET | Freq: Four times a day (QID) | ORAL | 0 refills | Status: AC | PRN

## 2020-12-25 MED ORDER — ONDANSETRON HCL 4 MG/2ML IJ SOLN
INTRAMUSCULAR | Status: DC | PRN
  Administered 2020-12-25: 4 mg via INTRAVENOUS

## 2020-12-25 MED ORDER — ACETAMINOPHEN 325 MG PO TABS: 650.0000 mg | ORAL_TABLET | Freq: Three times a day (TID) | ORAL | 0 refills | Status: AC | PRN

## 2020-12-25 MED ORDER — EPHEDRINE SULFATE 50 MG/ML IJ SOLN
INTRAMUSCULAR | Status: DC | PRN
  Administered 2020-12-25: 10 mg via INTRAVENOUS

## 2020-12-25 MED ORDER — MIDAZOLAM HCL 2 MG/2ML IJ SOLN
INTRAMUSCULAR | Status: AC
  Filled 2020-12-25: qty 2

## 2020-12-25 MED ORDER — CHLORHEXIDINE GLUCONATE CLOTH 2 % EX PADS: 6.0000 | MEDICATED_PAD | Freq: Once | CUTANEOUS | Status: DC

## 2020-12-25 MED ORDER — ROCURONIUM BROMIDE 10 MG/ML (PF) SYRINGE
PREFILLED_SYRINGE | INTRAVENOUS | Status: AC
  Filled 2020-12-25: qty 10

## 2020-12-25 MED ORDER — CEFAZOLIN SODIUM-DEXTROSE 2-4 GM/100ML-% IV SOLN
2.0000 g | INTRAVENOUS | Status: AC
  Administered 2020-12-25: 16:00:00 2 g via INTRAVENOUS
  Filled 2020-12-25: qty 100

## 2020-12-25 MED ORDER — LIDOCAINE HCL (CARDIAC) PF 100 MG/5ML IV SOSY
PREFILLED_SYRINGE | INTRAVENOUS | Status: DC | PRN
  Administered 2020-12-25: 100 mg via INTRAVENOUS

## 2020-12-25 MED ORDER — LIDOCAINE HCL (PF) 2 % IJ SOLN
INTRAMUSCULAR | Status: AC
  Filled 2020-12-25: qty 5

## 2020-12-25 MED ORDER — FENTANYL CITRATE (PF) 100 MCG/2ML IJ SOLN
INTRAMUSCULAR | Status: AC
  Administered 2020-12-25: 18:00:00 25 ug via INTRAVENOUS
  Filled 2020-12-25: qty 2

## 2020-12-25 MED ORDER — PANTOPRAZOLE SODIUM 20 MG PO TBEC
20.0000 mg | DELAYED_RELEASE_TABLET | Freq: Once | ORAL | Status: AC
  Administered 2020-12-25: 10:00:00 20 mg via ORAL
  Filled 2020-12-25: qty 1

## 2020-12-25 MED ORDER — OXYCODONE HCL 5 MG/5ML PO SOLN: 5.0000 mg | Freq: Once | ORAL | Status: AC | PRN

## 2020-12-25 MED ORDER — MIDAZOLAM HCL 2 MG/2ML IJ SOLN
INTRAMUSCULAR | Status: DC | PRN
  Administered 2020-12-25: 2 mg via INTRAVENOUS

## 2020-12-25 MED ORDER — FENTANYL CITRATE (PF) 100 MCG/2ML IJ SOLN
INTRAMUSCULAR | Status: AC
  Administered 2020-12-25: 17:00:00 50 ug via INTRAVENOUS
  Filled 2020-12-25: qty 2

## 2020-12-25 MED ORDER — CHLORHEXIDINE GLUCONATE 0.12 % MT SOLN
OROMUCOSAL | Status: AC
  Administered 2020-12-25: 10:00:00 15 mL via OROMUCOSAL
  Filled 2020-12-25: qty 15

## 2020-12-25 MED ORDER — DEXAMETHASONE SODIUM PHOSPHATE 10 MG/ML IJ SOLN
INTRAMUSCULAR | Status: DC | PRN
  Administered 2020-12-25: 10 mg via INTRAVENOUS

## 2020-12-25 MED ORDER — ONDANSETRON HCL 4 MG/2ML IJ SOLN
INTRAMUSCULAR | Status: AC
  Filled 2020-12-25: qty 2

## 2020-12-25 MED ORDER — LACTATED RINGERS IV SOLN: INTRAVENOUS | Status: DC

## 2020-12-25 MED ORDER — PROPOFOL 10 MG/ML IV BOLUS
INTRAVENOUS | Status: DC | PRN
  Administered 2020-12-25: 150 mg via INTRAVENOUS

## 2020-12-25 MED ORDER — ONDANSETRON HCL 4 MG/2ML IJ SOLN: 4.0000 mg | Freq: Once | INTRAMUSCULAR | Status: DC | PRN

## 2020-12-25 MED ORDER — ACETAMINOPHEN 500 MG PO TABS: 1000.0000 mg | ORAL_TABLET | Freq: Once | ORAL | Status: AC

## 2020-12-25 MED ORDER — OXYCODONE HCL 5 MG PO TABS
ORAL_TABLET | ORAL | Status: AC
  Filled 2020-12-25: qty 1

## 2020-12-25 MED ORDER — FENTANYL CITRATE (PF) 100 MCG/2ML IJ SOLN
INTRAMUSCULAR | Status: DC | PRN
  Administered 2020-12-25 (×2): 25 ug via INTRAVENOUS
  Administered 2020-12-25: 50 ug via INTRAVENOUS

## 2020-12-25 SURGICAL SUPPLY — 31 items
BLADE SURG 15 STRL LF DISP TIS (BLADE) ×1 IMPLANT
BLADE SURG 15 STRL SS (BLADE) ×1
CHLORAPREP W/TINT 26 (MISCELLANEOUS) ×2 IMPLANT
COVER WAND RF STERILE (DRAPES) ×2 IMPLANT
DERMABOND ADVANCED (GAUZE/BANDAGES/DRESSINGS)
DERMABOND ADVANCED .7 DNX12 (GAUZE/BANDAGES/DRESSINGS) ×1 IMPLANT
DRAPE LAPAROTOMY 77X122 PED (DRAPES) ×2 IMPLANT
ELECT REM PT RETURN 9FT ADLT (ELECTROSURGICAL) ×2
ELECTRODE REM PT RTRN 9FT ADLT (ELECTROSURGICAL) ×1 IMPLANT
GLOVE SURG SYN 6.5 ES PF (GLOVE) ×2 IMPLANT
GLOVE SURG SYN 6.5 PF PI (GLOVE) ×1 IMPLANT
GLOVE SURG UNDER POLY LF SZ7 (GLOVE) ×2 IMPLANT
GOWN STRL REUS W/ TWL LRG LVL3 (GOWN DISPOSABLE) ×3 IMPLANT
GOWN STRL REUS W/TWL LRG LVL3 (GOWN DISPOSABLE) ×3
KIT TURNOVER KIT A (KITS) ×2 IMPLANT
LABEL OR SOLS (LABEL) ×2 IMPLANT
MANIFOLD NEPTUNE II (INSTRUMENTS) ×2 IMPLANT
NEEDLE HYPO 22GX1.5 SAFETY (NEEDLE) ×2 IMPLANT
NS IRRIG 500ML POUR BTL (IV SOLUTION) ×2 IMPLANT
PACK BASIN MINOR ARMC (MISCELLANEOUS) ×2 IMPLANT
SUT ETHIBOND NAB MO 7 #0 18IN (SUTURE) ×2 IMPLANT
SUT MNCRL 4-0 (SUTURE) ×1
SUT MNCRL 4-0 27XMFL (SUTURE) ×1
SUT VIC AB 2-0 SH 27 (SUTURE) ×1
SUT VIC AB 2-0 SH 27XBRD (SUTURE) ×1 IMPLANT
SUT VIC AB 3-0 SH 27 (SUTURE) ×1
SUT VIC AB 3-0 SH 27X BRD (SUTURE) ×1 IMPLANT
SUTURE MNCRL 4-0 27XMF (SUTURE) ×1 IMPLANT
SYR 10ML LL (SYRINGE) ×4 IMPLANT
TOWEL OR 17X26 4PK STRL BLUE (TOWEL DISPOSABLE) ×2 IMPLANT
WATER STERILE IRR 1000ML POUR (IV SOLUTION) ×1 IMPLANT

## 2020-12-25 NOTE — Anesthesia Preprocedure Evaluation (Addendum)
Anesthesia Evaluation  Patient identified by MRN, date of birth, ID band Patient awake    Reviewed: Allergy & Precautions, H&P , NPO status , Patient's Chart, lab work & pertinent test results  History of Anesthesia Complications Negative for: history of anesthetic complications  Airway Mallampati: II  TM Distance: >3 FB    Comment: Large beard Dental  (+) Teeth Intact   Pulmonary neg pulmonary ROS, neg sleep apnea, neg COPD,    breath sounds clear to auscultation       Cardiovascular hypertension, (-) angina(-) Past MI and (-) Cardiac Stents (-) dysrhythmias  Rhythm:regular Rate:Normal     Neuro/Psych negative neurological ROS  negative psych ROS   GI/Hepatic negative GI ROS, Neg liver ROS,   Endo/Other  negative endocrine ROS  Renal/GU      Musculoskeletal   Abdominal   Peds  Hematology negative hematology ROS (+)   Anesthesia Other Findings Past Medical History: No date: Allergic rhinitis No date: Duodenitis No date: Dysuria No date: Family history of adverse reaction to anesthesia     Comment:  father - slow to wake No date: Gastritis No date: HLD (hyperlipidemia) No date: Hypercalcemia No date: Hypertension No date: Inflammatory arthritis No date: Low ferritin No date: Low testosterone No date: Mechanical back pain No date: Right humeral fracture No date: Right kidney stone  Past Surgical History: 02/28/2009: COLONOSCOPY W/ POLYPECTOMY 12/01/2018: COLONOSCOPY WITH PROPOFOL; N/A     Comment:  Procedure: COLONOSCOPY WITH PROPOFOL;  Surgeon:               Christena Deem, MD;  Location: [facility] ENDOSCOPY;                Service: Endoscopy;  Laterality: N/A; 10/10/1998: ESOPHAGOGASTRODUODENOSCOPY     Comment:  Dr. Elsie Stain gastritis and duodenitis 02/16/2002: FLEXIBLE SIGMOIDOSCOPY     Comment:  Dr. Mechele Collin 04/17/2009: HEMORRHOID SURGERY     Comment:  varices/thrombosis surgery Dr. Katrinka Blazing No date:  NASAL SINUS SURGERY 04/21/2016: SHOULDER ARTHROSCOPY; Right     Comment:  Procedure: Limited arthroscopic debridement,               arthroscopic subacromial decompression, and mini-open               rotator cuff exploration, right shoulder.;  Surgeon: Christena Flake, MD;  Location: [facility];  Service:               Orthopedics;  Laterality: Right; No date: TONSILLECTOMY  BMI    Body Mass Index: 25.07 kg/m      Reproductive/Obstetrics negative OB ROS                            Anesthesia Physical Anesthesia Plan  ASA: II  Anesthesia Plan: General   Post-op Pain Management:    Induction:   PONV Risk Score and Plan: Ondansetron, Dexamethasone, Midazolam and Treatment may vary due to age or medical condition  Airway Management Planned: LMA  Additional Equipment:   Intra-op Plan:   Post-operative Plan: Extubation in OR  Informed Consent: I have reviewed the patients History and Physical, chart, labs and discussed the procedure including the risks, benefits and alternatives for the proposed anesthesia with the patient or authorized representative who has indicated his/her understanding and acceptance.     Dental Advisory Given  Plan Discussed with: Anesthesiologist, CRNA and Surgeon  Anesthesia Plan Comments:       Anesthesia Quick Evaluation

## 2020-12-25 NOTE — Interval H&P Note (Signed)
History and Physical Interval Note:  12/25/2020 12:14 PM  Anthony Williamson  has presented today for surgery, with the diagnosis of K42.9.  The various methods of treatment have been discussed with the patient and family. After consideration of risks, benefits and other options for treatment, the patient has consented to  Procedure(s): HERNIA REPAIR UMBILICAL ADULT (N/A) as a surgical intervention.  The patient's history has been reviewed, patient examined, no change in status, stable for surgery.  I have reviewed the patient's chart and labs.  Questions were answered to the patient's satisfaction.     Dusty Wagoner Tonna Boehringer

## 2020-12-25 NOTE — Anesthesia Procedure Notes (Signed)
Procedure Name: LMA Insertion Date/Time: 12/25/2020 4:17 PM Performed by: Ginger Carne, CRNA Pre-anesthesia Checklist: Patient identified, Emergency Drugs available, Suction available, Patient being monitored and Timeout performed Patient Re-evaluated:Patient Re-evaluated prior to induction Oxygen Delivery Method: Circle system utilized Preoxygenation: Pre-oxygenation with 100% oxygen Induction Type: IV induction LMA: LMA inserted LMA Size: 4.5 Tube type: Oral Number of attempts: 1 Placement Confirmation: positive ETCO2 and breath sounds checked- equal and bilateral Tube secured with: Tape Dental Injury: Teeth and Oropharynx as per pre-operative assessment

## 2020-12-25 NOTE — Discharge Instructions (Signed)
AMBULATORY SURGERY  DISCHARGE INSTRUCTIONS   1) The drugs that you were given will stay in your system until tomorrow so for the next 24 hours you should not:  A) Drive an automobile B) Make any legal decisions C) Drink any alcoholic beverage   2) You may resume regular meals tomorrow.  Today it is better to start with liquids and gradually work up to solid foods.  You may eat anything you prefer, but it is better to start with liquids, then soup and crackers, and gradually work up to solid foods.   3) Please notify your doctor immediately if you have any unusual bleeding, trouble breathing, redness and pain at the surgery site, drainage, fever, or pain not relieved by medication. 4)   5) Your post-operative visit with Dr.                                     is: Date:                        Time:    Please call to schedule your post-operative visit.  6) Additional Instructions:      Hernia repair, Care After This sheet gives you information about how to care for yourself after your procedure. Your health care provider may also give you more specific instructions. If you have problems or questions, contact your health care provider. What can I expect after the procedure? After your procedure, it is common to have the following:  Pain in your abdomen, especially in the incision areas. You will be given medicine to control the pain.  Tiredness. This is a normal part of the recovery process. Your energy level will return to normal over the next several weeks.  Changes in your bowel movements, such as constipation or needing to go more often. Talk with your health care provider about how to manage this. Follow these instructions at home: Medicines   tylenol  as needed for discomfort.    Use narcotics, if prescribed, only when tylenol and motrin is not enough to control pain.   325-650mg  every 8hrs to max of 3000mg /24hrs (including the 325mg  in every norco dose) for the  tylenol.      PLEASE RECORD NUMBER OF PILLS TAKEN UNTIL NEXT FOLLOW UP APPT.  THIS WILL HELP DETERMINE HOW READY YOU ARE TO BE RELEASED FROM ANY ACTIVITY RESTRICTIONS  Do not drive or use heavy machinery while taking prescription pain medicine.  Do not drink alcohol while taking prescription pain medicine.  Incision care     Follow instructions from your health care provider about how to take care of your incision areas. Make sure you: ? Keep your incisions clean and dry. ? Wash your hands with soap and water before and after applying medicine to the areas, and before and after changing your bandage (dressing). If soap and water are not available, use hand sanitizer. ? Change your dressing as told by your health care provider. ? Leave stitches (sutures), skin glue, or adhesive strips in place. These skin closures may need to stay in place for 2 weeks or longer. If adhesive strip edges start to loosen and curl up, you may trim the loose edges. Do not remove adhesive strips completely unless your health care provider tells you to do that.  Do not wear tight clothing over the incisions. Tight clothing may rub and irritate the  incision areas, which may cause the incisions to open.  Do not take baths, swim, or use a hot tub until your health care provider approves. OK TO SHOWER IN 24HRS.    Check your incision area every day for signs of infection. Check for: ? More redness, swelling, or pain. ? More fluid or blood. ? Warmth. ? Pus or a bad smell. Activity  Avoid lifting anything that is heavier than 10 lb (4.5 kg) for 2 weeks or until your health care provider says it is okay.  No pushing/pulling greater than 30lbs  You may resume normal activities as told by your health care provider. Ask your health care provider what activities are safe for you.  Take rest breaks during the day as needed. Eating and drinking  Follow instructions from your health care provider about what you can  eat after surgery.  To prevent or treat constipation while you are taking prescription pain medicine, your health care provider may recommend that you: ? Drink enough fluid to keep your urine clear or pale yellow. ? Take over-the-counter or prescription medicines. ? Eat foods that are high in fiber, such as fresh fruits and vegetables, whole grains, and beans. ? Limit foods that are high in fat and processed sugars, such as fried and sweet foods. General instructions  Ask your health care provider when you will need an appointment to get your sutures or staples removed.  Keep all follow-up visits as told by your health care provider. This is important. Contact a health care provider if:  You have more redness, swelling, or pain around your incisions.  You have more fluid or blood coming from the incisions.  Your incisions feel warm to the touch.  You have pus or a bad smell coming from your incisions or your dressing.  You have a fever.  You have an incision that breaks open (edges not staying together) after sutures or staples have been removed. Get help right away if:  You develop a rash.  You have chest pain or difficulty breathing.  You have pain or swelling in your legs.  You feel light-headed or you faint.  Your abdomen swells (becomes distended).  You have nausea or vomiting.  You have blood in your stool (feces). This information is not intended to replace advice given to you by your health care provider. Make sure you discuss any questions you have with your health care provider. Document Released: 07/02/2005 Document Revised: 09/01/2018 Document Reviewed: 09/13/2016 Elsevier Interactive Patient Education  2019 [employer].

## 2020-12-25 NOTE — Op Note (Signed)
Preoperative diagnosis: umbilical hernia, reducible Postoperative diagnosis: same  Procedure:  Open umbilical hernia repair  Anesthesia: LMA  Surgeon: Sung Amabile  Wound Classification: Clean  Specimen: none  Complications: None  Estimated Blood Loss: minimal  Indications:see HPI  Findings: 1. 1cm x 1cm reducible umbilical hernia 2. Tension free repair achieved with suture 3. Adequate hemostasis  Description of procedure: The patient was brought to the operating room and general anesthesia was induced. A time-out was completed verifying correct patient, procedure, site, positioning, and implant(s) and/or special equipment prior to beginning this procedure. Antibiotics were administered prior to making the incision. SCDs placed. The anterior abdominal wall was prepped and draped in the standard sterile fashion.   An infraumbilical incision was made after infusing the preplanned incision with half percent Marcaine.  Dissection carried down to fascia where the umbilical stalk was noted.  The stalk was transected and there was noted to be a 1cm x 1cm umbilical hernia.  The preperitoneal fat contents were dissected off the surrounding structures and reduced.  Hemostasis was confirmed prior to reducing the actual contents.  The defect itself was primary closed using 0 Ethibond in an interrupted fashion.  The fascia as well as the skin incision was then infused with exparel.  After confirming hemostasis, the umbilical stalk was reattached to the abdominal wall using 2-0 Vicryl and the wound was irrigated and closed in a multilayer fashion, using 3-0 Vicryl for the deep dermal layer in an interrupted fashion and running 4-0 Monocryl in a subcuticular fashion.  Wound was then dressed with Dermabond.  Patient was then successfully awakened and transferred to PACU in stable condition.  At the end of the procedure sponge and instrument counts were correct

## 2020-12-26 ENCOUNTER — Encounter: Payer: Self-pay | Admitting: Surgery

## 2021-01-05 NOTE — Progress Notes (Signed)
01/06/2021 9:46 AM   Anthony Williamson [birth year] [id number]  Referring provider: Idelle Crouch, MD [facility] [place],  [place] [address]  Chief Complaint  Patient presents with  . Benign Prostatic Hypertrophy   Urological history 1. Nephrolithiasis - stone composition calcium oxalate monohydrate 45%, calcium oxalate dihydrate 35% and calcium phosphate at 5%.   - right ESWL for a 1.3 cm right renal stone on 02/27/2015 - CT Renal stone study on 01/05/2019 revealed nonobstructing right renal stones - metabolic work up 38/1017 extreme hypercalciuria, borderline hyperoxaluria, high urine pH, mild CaOx stone risk and high CaP stone risk - KUB 12/2020 negative  2. BPH with LU TS - I PSS 5/0 - father with non-lethal prostate cancer  HPI: [name] is a 60 year male who presents today for his yearly visit with his wife, Anthony Williamson.   Nocturia x 1-2, but he can have some rare nights of 3 to 4 times at night.  He does not have daytime frequency.  He and his wife denied frank snoring, but wife states when he falls asleep on his back he makes a "puffing" sound.  He is also falling asleep during the day and wakes up for unknown reasons every 3 hours at night.  In fact, he states some of his nocturia is just attributed to the fact that he is awake and he might as well use the restroom.  Patient denies any modifying or aggravating factors.  Patient denies any gross hematuria, dysuria or suprapubic/flank pain.  Patient denies any fevers, chills, nausea or vomiting.    IPSS    Row Name 01/06/21 0900         International Prostate Symptom Score   How often have you had the sensation of not emptying your bladder? Not at All     How often have you had to urinate less than every two hours? Less than 1 in 5 times     How often have you found you stopped and started again several times when you urinated? Less than 1 in 5 times     How often have you found it difficult  to postpone urination? Not at All     How often have you had a weak urinary stream? Less than 1 in 5 times     How often have you had to strain to start urination? Not at All     How many times did you typically get up at night to urinate? 2 Times     Total IPSS Score 5           Quality of Life due to urinary symptoms   If you were to spend the rest of your life with your urinary condition just the way it is now how would you feel about that? Delighted            Score:  1-7 Mild 8-19 Moderate 20-35 Severe  KUB taken today did not demonstrate any nephrolithiasis. He has not had any episodes of renal colic, passage of fragments or gross hematuria. Serum creatinine normal at 0.9 on November 10, 2020  PMH: Past Medical History:  Diagnosis Date  . Allergic rhinitis   . Duodenitis   . Dysuria   . Family history of adverse reaction to anesthesia    father - slow to wake  . Gastritis   . HLD (hyperlipidemia)   . Hypercalcemia   . Hypertension   . Inflammatory arthritis   .  Low ferritin   . Low testosterone   . Mechanical back pain   . Right humeral fracture   . Right kidney stone     Surgical History: Past Surgical History:  Procedure Laterality Date  . COLONOSCOPY W/ POLYPECTOMY  02/28/2009  . COLONOSCOPY WITH PROPOFOL N/A 12/01/2018   Procedure: COLONOSCOPY WITH PROPOFOL;  Surgeon: Lollie Sails, MD;  Location: [facility] ENDOSCOPY;  Service: Endoscopy;  Laterality: N/A;  . ESOPHAGOGASTRODUODENOSCOPY  10/10/1998   Dr. Ara Kussmaul gastritis and duodenitis  . FLEXIBLE SIGMOIDOSCOPY  02/16/2002   Dr. Vira Agar  . HEMORRHOID SURGERY  04/17/2009   varices/thrombosis surgery Dr. Tamala Julian  . NASAL SINUS SURGERY    . SHOULDER ARTHROSCOPY Right 04/21/2016   Procedure: Limited arthroscopic debridement, arthroscopic subacromial decompression, and mini-open rotator cuff exploration, right shoulder.;  Surgeon: Corky Mull, MD;  Location: [facility];  Service: Orthopedics;   Laterality: Right;  . TONSILLECTOMY    . UMBILICAL HERNIA REPAIR N/A 12/25/2020   Procedure: HERNIA REPAIR UMBILICAL ADULT;  Surgeon: Benjamine Sprague, DO;  Location: [facility] ORS;  Service: General;  Laterality: N/A;    Home Medications:  Allergies as of 01/06/2021      Reactions   Ranitidine Hcl Swelling, Cough   Swelling of the throat   Zithromax [azithromycin] Itching, Nausea And Vomiting   Etodolac Nausea And Vomiting   Hctz [hydrochlorothiazide] Cough      Medication List       Accurate as of January 06, 2021  9:46 AM. If you have any questions, ask your nurse or doctor.        acetaminophen 325 MG tablet Commonly known as: Tylenol Take 2 tablets (650 mg total) by mouth every 8 (eight) hours as needed for mild pain.   amLODipine 5 MG tablet Commonly known as: NORVASC Take 5 mg by mouth in the morning and at bedtime.   DHEA 50 MG Tabs Take 100 mg by mouth daily.   docusate sodium 100 MG capsule Commonly known as: COLACE Take 100 mg by mouth daily.   HYDROcodone-acetaminophen 5-325 MG tablet Commonly known as: Norco Take 1 tablet by mouth every 6 (six) hours as needed for up to 6 doses for moderate pain.   naproxen sodium 220 MG tablet Commonly known as: ALEVE Take 220 mg by mouth daily as needed (pain).   rosuvastatin 5 MG tablet Commonly known as: CRESTOR Take 5 mg by mouth See admin instructions. Take 1 tablet on Tue, Thu, Sat only.       Allergies:  Allergies  Allergen Reactions  . Ranitidine Hcl Swelling and Cough    Swelling of the throat  . Zithromax [Azithromycin] Itching and Nausea And Vomiting  . Etodolac Nausea And Vomiting  . Hctz [Hydrochlorothiazide] Cough    Family History: Family History  Problem Relation Age of Onset  . Coronary artery disease Other   . Prostate cancer Father   . Kidney disease Neg Hx   . Kidney cancer Neg Hx   . Bladder Cancer Neg Hx     Social History:  reports that he has never smoked. He has never used smokeless  tobacco. He reports that he does not drink alcohol and does not use drugs.  Pertinent ROS in HPI  Physical Exam: BP 121/77   Pulse (!) 56   Ht 5' 9" (1.753 m)   Wt 171 lb 3.2 oz (77.7 kg)   BMI 25.28 kg/m   Constitutional:  Well nourished. Alert and oriented, No acute distress. HEENT: [place]  AT, mask in place.  Trachea midline Cardiovascular: No clubbing, cyanosis, or edema. Respiratory: Normal respiratory effort, no increased work of breathing. GI: Umbilical hernia incision is clean and dry.  GU: No CVA tenderness.  No bladder fullness or masses.  Patient with circumcised phallus.  Urethral meatus is patent.  No penile discharge. No penile lesions or rashes. Scrotum without lesions, cysts, rashes and/or edema.  Testicles are located scrotally bilaterally. No masses are appreciated in the testicles. Left and right epididymis are normal. Rectal: Patient with  normal sphincter tone. Anus and perineum without scarring or rashes. No rectal masses are appreciated. Prostate is approximately 45 grams, no nodules are appreciated. Seminal vesicles could not be palpated Lymph: No inguinal adenopathy. Neurologic: Grossly intact, no focal deficits, moving all 4 extremities. Psychiatric: Normal mood and affect.  Laboratory Data: Specimen:  Blood  Ref Range & Units 1 mo ago  Glucose 70 - 110 mg/dL 96   Sodium 136 - 145 mmol/L 138   Potassium 3.6 - 5.1 mmol/L 4.5   Chloride 97 - 109 mmol/L 105   Carbon Dioxide (CO2) 22.0 - 32.0 mmol/L 29.6   Urea Nitrogen (BUN) 7 - 25 mg/dL 16   Creatinine 0.7 - 1.3 mg/dL 0.9   Glomerular Filtration Rate (eGFR), MDRD Estimate >60 mL/min/1.73sq m 87   Calcium 8.7 - 10.3 mg/dL 9.8   AST  8 - 39 U/L 23   ALT  6 - 57 U/L 26   Alk Phos (alkaline Phosphatase) 34 - 104 U/L 61   Albumin 3.5 - 4.8 g/dL 4.4   Bilirubin, Total 0.3 - 1.2 mg/dL 0.7   Protein, Total 6.1 - 7.9 g/dL 6.9   A/G Ratio 1.0 - 5.0 gm/dL 1.8   Resulting Agency  [facility]  Specimen  Collected: 11/10/20 10:18 AM Last Resulted: 11/10/20 12:47 PM  Received From: [place]  Result Received: 12/10/20 12:49 PM   Specimen:  Blood  Ref Range & Units 1 mo ago  WBC (White Blood Cell Count) 4.1 - 10.2 10^3/uL 7.4   RBC (Red Blood Cell Count) 4.69 - 6.13 10^6/uL 5.27   Hemoglobin 14.1 - 18.1 gm/dL 16.2   Hematocrit 40.0 - 52.0 % 47.3   MCV (Mean Corpuscular Volume) 80.0 - 100.0 fl 89.8   MCH (Mean Corpuscular Hemoglobin) 27.0 - 31.2 pg 30.7   MCHC (Mean Corpuscular Hemoglobin Concentration) 32.0 - 36.0 gm/dL 34.2   Platelet Count 150 - 450 10^3/uL 234   RDW-CV (Red Cell Distribution Width) 11.6 - 14.8 % 14.3   MPV (Mean Platelet Volume) 9.4 - 12.4 fl 9.5   Neutrophils 1.50 - 7.80 10^3/uL 4.32   Lymphocytes 1.00 - 3.60 10^3/uL 2.23   Monocytes 0.00 - 1.50 10^3/uL 0.77   Eosinophils 0.00 - 0.55 10^3/uL 0.05   Basophils 0.00 - 0.09 10^3/uL 0.03   Neutrophil % 32.0 - 70.0 % 58.1   Lymphocyte % 10.0 - 50.0 % 30.1   Monocyte % 4.0 - 13.0 % 10.4   Eosinophil % 1.0 - 5.0 % 0.7Low   Basophil% 0.0 - 2.0 % 0.4   Immature Granulocyte % <=0.7 % 0.3   Immature Granulocyte Count <=0.06 10^3/L 0.02   Resulting Agency  [facility] - LAB  Specimen Collected: 11/10/20 10:18 AM Last Resulted: 11/10/20 10:44 AM  Received From: [facility]  Result Received: 12/10/20 12:49 PM   Specimen:  Blood  Ref Range & Units 1 mo ago  Cholesterol, Total 100 -  200 mg/dL 213High   Triglyceride 35 - 199 mg/dL 110   HDL (High Density Lipoprotein) Cholesterol 29.0 - 71.0 mg/dL 49.5   LDL Calculated 0 - 130 mg/dL 142High   VLDL Cholesterol mg/dL 22   Cholesterol/HDL Ratio  4.3   Resulting [place] [facility] - LAB  Specimen Collected: 11/10/20 10:18 AM Last Resulted: 11/10/20 12:47 PM  Received From: [facility]  Result Received: 12/10/20 12:49 PM   Specimen:  Urine  Ref Range & Units 1 mo ago  Color Yellow, Violet, Light  Violet, Dark Violet Yellow   Clarity Clear Clear   Specific Gravity 1.000 - 1.030 1.010   pH, Urine 5.0 - 8.0 7.5   Protein, Urinalysis Negative, Trace mg/dL Negative   Glucose, Urinalysis Negative mg/dL Negative   Ketones, Urinalysis Negative mg/dL Negative   Blood, Urinalysis Negative Negative   Nitrite, Urinalysis Negative Negative   Leukocyte Esterase, Urinalysis Negative Negative   White Blood Cells, Urinalysis None Seen, 0-3 /hpf None Seen   Red Blood Cells, Urinalysis None Seen, 0-3 /hpf None Seen   Bacteria, Urinalysis None Seen /hpf None Seen   Squamous Epithelial Cells, Urinalysis Rare, Few, None Seen /hpf None Seen   Resulting Agency  [facility] - LAB  Specimen Collected: 11/10/20 10:18 AM Last Resulted: 11/10/20 10:56 AM  Received From: [facility]  Result Received: 12/10/20 12:49 PM   PSA history  0.78 ng/mL on 11/29/2014  0.67 ng/mL on 06/13/2015  0.61 ng/mL on 03/04/2017  Component     Latest Ref Rng & Units 03/03/2018 01/15/2019  Prostatic Specific Antigen     0.00 - 4.00 ng/mL 0.64 0.68  0.52 ng/mL on 10/2019 I have reviewed the labs.  Pertinent Imaging CLINICAL DATA:  Nephrolithiasis  EXAM: ABDOMEN - 1 VIEW  COMPARISON:  January 03, 2020  FINDINGS: There is a stable apparent phleboliths in the lower left pelvis. No other abnormal calcifications are evident. There is moderate stool throughout the colon. There is no bowel dilatation or air-fluid level to suggest bowel obstruction. No free air. Visualized lung bases clear.  IMPRESSION: Apparent phlebolith lower left pelvis. No other abnormal calcification. No bowel obstruction or free air.   Electronically Signed   By: Lowella Grip III M.D.   On: 01/06/2021 08:53 I have independently reviewed the films.  See HPI.   Assessment & Plan:    1. Nephrolithiasis - no stones seen on recent KUB -explained that the reason why stones may not show on KUB is they are not  present, they are too small for the x-ray to capture or they are radiolucent - no recent episodes of renal colic, passage of fragments or hematuria - will follow clinically at this time  2. BPH with LUTS IPSS score is 5/0 Continue conservative management, avoiding bladder irritants and timed voiding's Most bothersome symptoms is/are nocturia  Today's PSA pending RTC in 12 months for IPSS, PSA and exam   3. Nocturia Discussed with the patient and his wife that the noises he makes while he is on his back, the night time urination, the daytime sleepiness and the periodic waking up at night may be symptoms of sleep apnea and to discuss this further with Dr. Doy Hutching to see if he needs further evaluation with a sleep study  Return in about 1 year (around 01/06/2022) for IPSS, PSA and exam.  These notes generated with voice recognition software. I apologize for typographical errors.  Kathaleya Mcduffee, PA-C  [place], [place] [address] [phone]

## 2021-01-06 ENCOUNTER — Ambulatory Visit
Admission: RE | Admit: 2021-01-06 | Discharge: 2021-01-06 | Disposition: A | Discharge status: Home / Self Care | Payer: Federal, State, Local not specified - PPO | Source: Ambulatory Visit | Attending: Urology | Admitting: Urology

## 2021-01-06 ENCOUNTER — Ambulatory Visit (INDEPENDENT_AMBULATORY_CARE_PROVIDER_SITE_OTHER): Payer: Federal, State, Local not specified - PPO | Admitting: Urology

## 2021-01-06 ENCOUNTER — Ambulatory Visit
Admission: RE | Admit: 2021-01-06 | Discharge: 2021-01-06 | Disposition: A | Discharge status: Home / Self Care | Payer: Federal, State, Local not specified - PPO | Attending: Urology | Admitting: Urology

## 2021-01-06 ENCOUNTER — Other Ambulatory Visit: Payer: Self-pay

## 2021-01-06 ENCOUNTER — Encounter: Payer: Self-pay | Admitting: Surgery

## 2021-01-06 VITALS — BP 121/77 | HR 56 | Ht 69.0 in | Wt 171.2 lb

## 2021-01-06 DIAGNOSIS — R351 Nocturia: Secondary | ICD-10-CM

## 2021-01-06 DIAGNOSIS — N138 Other obstructive and reflux uropathy: Secondary | ICD-10-CM | POA: Diagnosis not present

## 2021-01-06 DIAGNOSIS — N401 Enlarged prostate with lower urinary tract symptoms: Secondary | ICD-10-CM | POA: Diagnosis not present

## 2021-01-06 DIAGNOSIS — N200 Calculus of kidney: Secondary | ICD-10-CM | POA: Insufficient documentation

## 2021-01-06 NOTE — Transfer of Care (Signed)
Immediate Anesthesia Transfer of Care Note  Patient: Anthony Williamson  Procedure(s) Performed: HERNIA REPAIR UMBILICAL ADULT (N/A )  Patient Location: PACU  Anesthesia Type:General  Level of Consciousness: awake, alert  and oriented  Airway & Oxygen Therapy: Patient Spontanous Breathing  Post-op Assessment: Report given to RN  Post vital signs: Reviewed and stable  Last Vitals:  Vitals Value Taken Time  BP 146/71 12/25/20 1821  Temp 36.5 C 12/25/20 1821  Pulse 58 12/25/20 1821  Resp 16 12/25/20 1821  SpO2 97 % 12/25/20 1821    Last Pain:  Vitals:   12/25/20 1821  TempSrc: Temporal  PainSc: 4          Complications: No complications documented.

## 2021-01-06 NOTE — Anesthesia Postprocedure Evaluation (Signed)
Anesthesia Post Note  Patient: Anthony Williamson  Procedure(s) Performed: HERNIA REPAIR UMBILICAL ADULT (N/A )  Patient location during evaluation: PACU Anesthesia Type: General Level of consciousness: awake and alert and oriented Pain management: pain level controlled Vital Signs Assessment: post-procedure vital signs reviewed and stable Respiratory status: spontaneous breathing Cardiovascular status: blood pressure returned to baseline Anesthetic complications: no   No complications documented.   Last Vitals:  Vitals:   12/25/20 1758 12/25/20 1821  BP: 139/85 (!) 146/71  Pulse: 72 (!) 58  Resp: 18 16  Temp:  36.5 C  SpO2: 98% 97%    Last Pain:  Vitals:   12/25/20 1821  TempSrc: Temporal  PainSc: 4                  Adrianna Dudas

## 2021-01-07 LAB — PSA: Prostate Specific Ag, Serum: 0.8 ng/mL (ref 0.0–4.0)

## 2021-12-10 ENCOUNTER — Other Ambulatory Visit: Payer: Self-pay

## 2021-12-10 DIAGNOSIS — N138 Other obstructive and reflux uropathy: Secondary | ICD-10-CM

## 2022-01-04 ENCOUNTER — Other Ambulatory Visit: Payer: Federal, State, Local not specified - PPO

## 2022-01-04 ENCOUNTER — Other Ambulatory Visit: Payer: Self-pay

## 2022-01-04 DIAGNOSIS — N138 Other obstructive and reflux uropathy: Secondary | ICD-10-CM

## 2022-01-04 IMAGING — CR DG ABDOMEN 1V
1 series · 2 of 2 positions shown · non-contrast
Comparison: January 03, 2020

CLINICAL DATA: Nephrolithiasis

EXAM:
ABDOMEN - 1 VIEW

[Series 1: dg abd 1 view · 0.14mm/px · 2 of 2 slices shown]
[im 1/2]
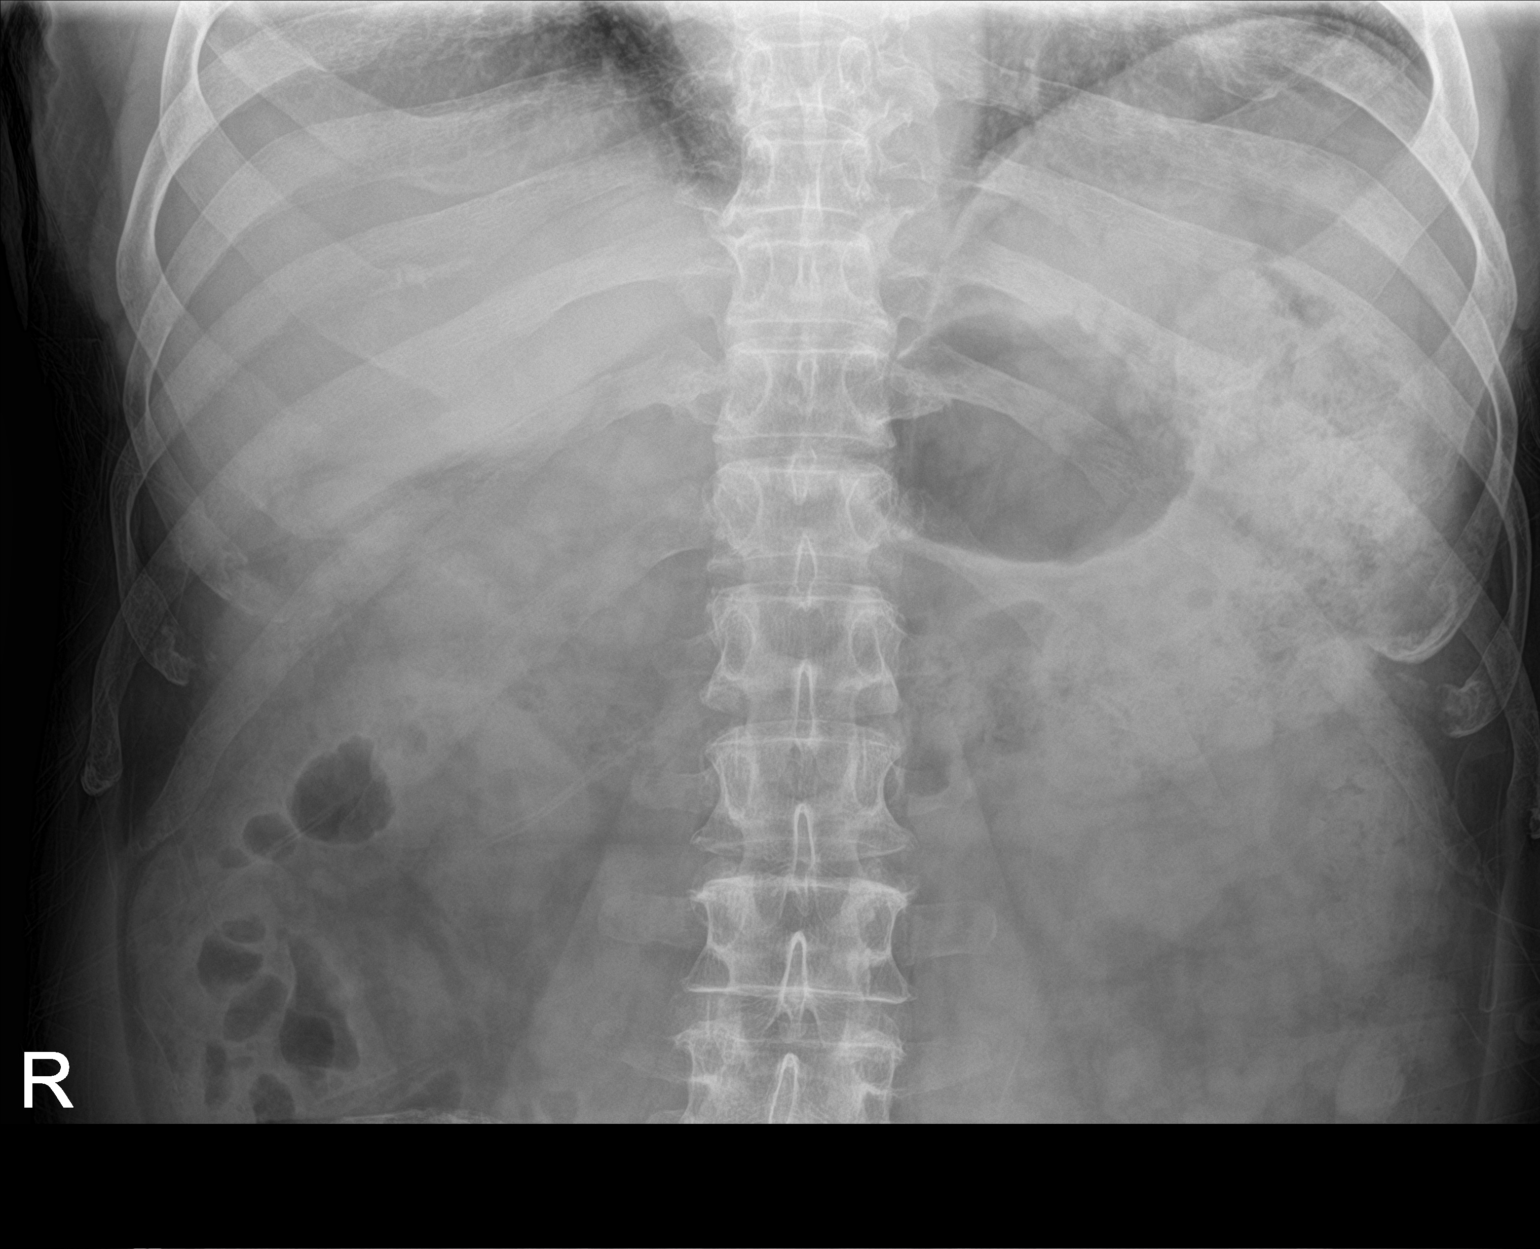
[im 2/2]
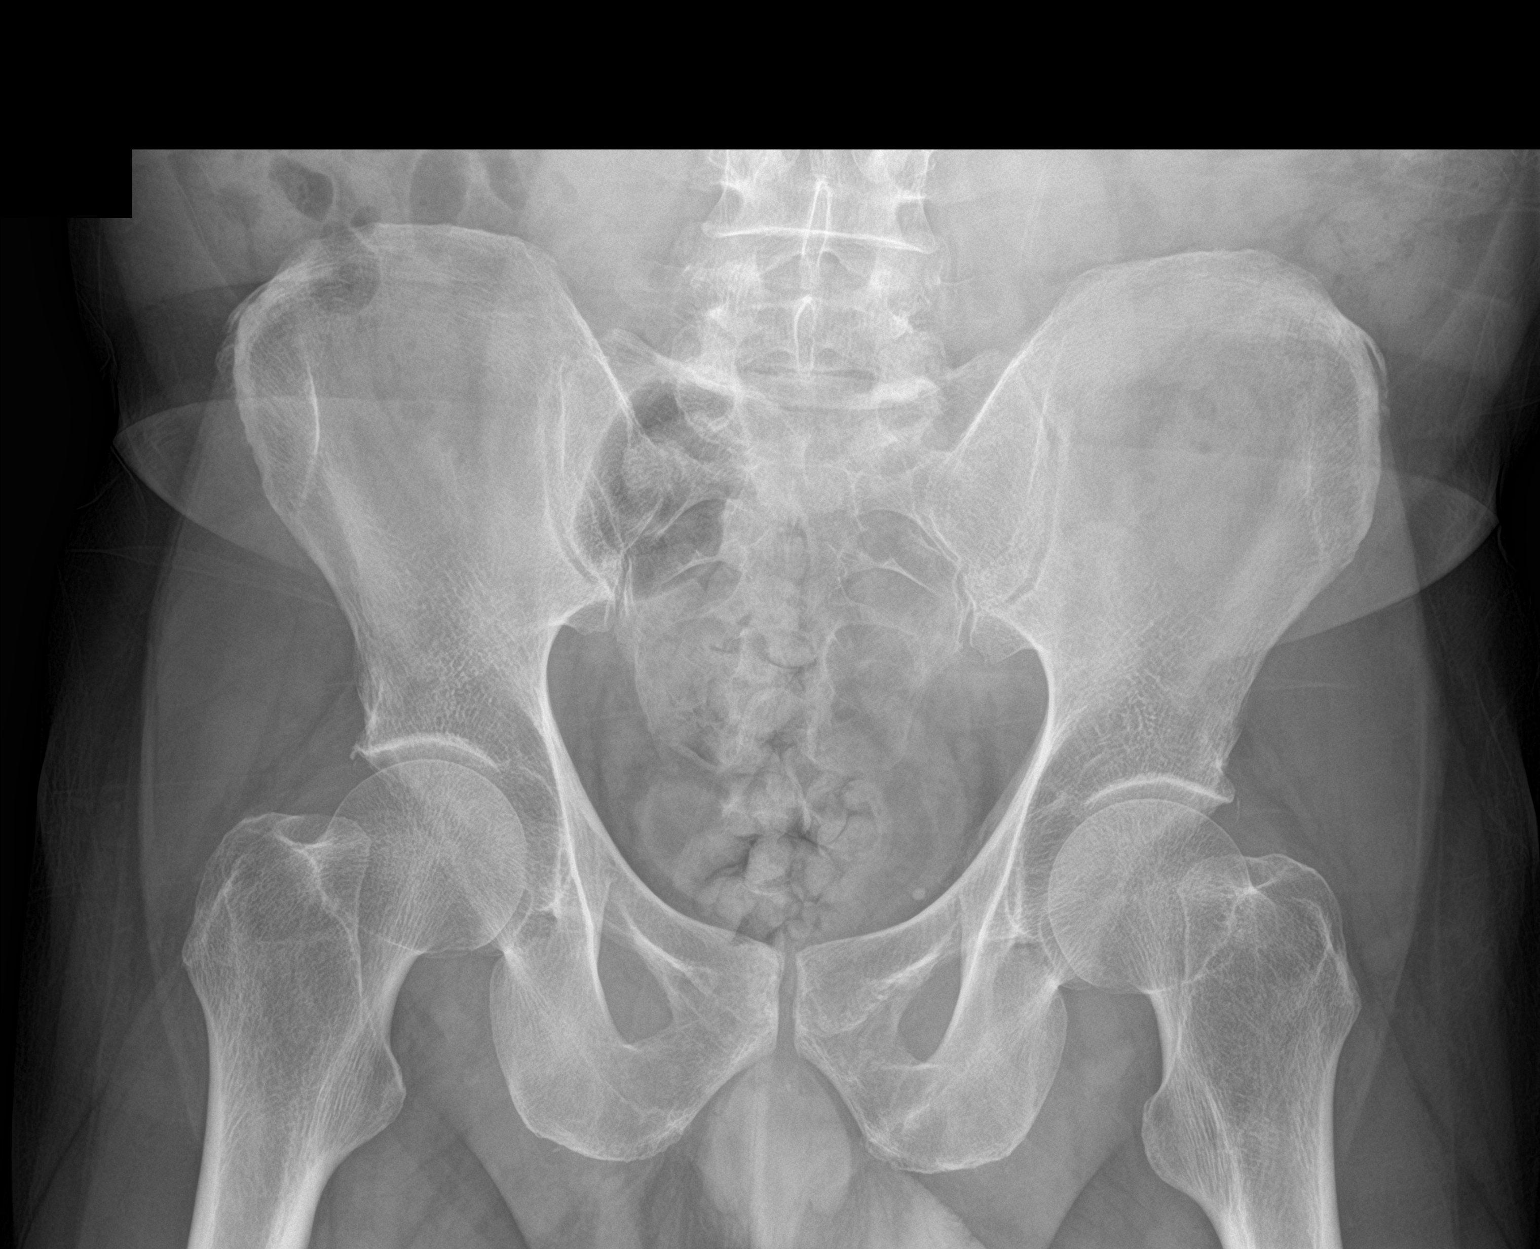

[2 of 2 positions shown; findings below may reference images not displayed]

FINDINGS: There is a stable apparent phleboliths in the lower left pelvis. No
other abnormal calcifications are evident. There is moderate stool
throughout the colon. There is no bowel dilatation or air-fluid
level to suggest bowel obstruction. No free air. Visualized lung
bases clear.
IMPRESSION: Apparent phlebolith lower left pelvis. No other abnormal
calcification. No bowel obstruction or free air.

## 2022-01-05 LAB — PSA: Prostate Specific Ag, Serum: 0.9 ng/mL (ref 0.0–4.0)

## 2022-01-07 NOTE — Progress Notes (Signed)
01/08/2022 8:58 AM   Anthony Williamson 1961/12/07 [id number]  Referring provider: Idelle Crouch, MD [facility] [place],  [place] [address]  Chief Complaint  Patient presents with   Nephrolithiasis   Bladder Prolapse   Nocturia   Urological history 1. Nephrolithiasis - stone composition calcium oxalate monohydrate 45%, calcium oxalate dihydrate 35% and calcium phosphate at 5%.   - right ESWL for a 1.3 cm right renal stone on 02/27/2015 - CT Renal stone study on 01/05/2019 revealed nonobstructing right renal stones - metabolic work up 45/8099 extreme hypercalciuria, borderline hyperoxaluria, high urine pH, mild CaOx stone risk and high CaP stone risk - KUB 12/2020 negative -serum creatinine 0.8 10/2021 -uric acid 5.4 10/2021 -UA pH 6.0, neg for micro heme 10/2021   2. BPH with LU TS -PSA 0.9 12/2021 -I PSS 8/1  3. Family history of prostate cancer - father with non-lethal prostate cancer  HPI: Anthony Williamson is a 61 y.o. male who presents today for his yearly visit.  At his visit last year, he was having episodes of nocturia and wife was witnessing some possible apnea events.  He was encouraged to reach out to his PCP to discuss this further.  His KUB last year did not note any radiopaque calcifications suspicious for renal or ureteral stones.  He has had no passage of fragments, no episodes of renal colic or gross hematuria over the last year.  He did speak with his PCP regarding sleep apnea, but he states he had some sinus issues taking care of and his urinary symptoms have improved.  Patient denies any modifying or aggravating factors.  Patient denies any gross hematuria, dysuria or suprapubic/flank pain.  Patient denies any fevers, chills, nausea or vomiting.     IPSS     Row Name 01/08/22 0800         International Prostate Symptom Score   How often have you had the sensation of not emptying your bladder? More than half the time      How often have you had to urinate less than every two hours? Less than 1 in 5 times     How often have you found you stopped and started again several times when you urinated? Not at All     How often have you found it difficult to postpone urination? Not at All     How often have you had a weak urinary stream? Less than 1 in 5 times     How often have you had to strain to start urination? Not at All     How many times did you typically get up at night to urinate? 2 Times     Total IPSS Score 8       Quality of Life due to urinary symptoms   If you were to spend the rest of your life with your urinary condition just the way it is now how would you feel about that? Pleased               Score:  1-7 Mild 8-19 Moderate 20-35 Severe   PMH: Past Medical History:  Diagnosis Date   Allergic rhinitis    Duodenitis    Dysuria    Family history of adverse reaction to anesthesia    father - slow to wake   Gastritis    HLD (hyperlipidemia)    Hypercalcemia    Hypertension    Inflammatory arthritis    Low  ferritin    Low testosterone    Mechanical back pain    Right humeral fracture    Right kidney stone     Surgical History: Past Surgical History:  Procedure Laterality Date   COLONOSCOPY W/ POLYPECTOMY  02/28/2009   COLONOSCOPY WITH PROPOFOL N/A 12/01/2018   Procedure: COLONOSCOPY WITH PROPOFOL;  Surgeon: Lollie Sails, MD;  Location: [facility] ENDOSCOPY;  Service: Endoscopy;  Laterality: N/A;   ESOPHAGOGASTRODUODENOSCOPY  10/10/1998   Dr. Ara Kussmaul gastritis and duodenitis   FLEXIBLE SIGMOIDOSCOPY  02/16/2002   Dr. Vira Agar   HEMORRHOID SURGERY  04/17/2009   varices/thrombosis surgery Dr. Tamala Julian   NASAL SINUS SURGERY     SHOULDER ARTHROSCOPY Right 04/21/2016   Procedure: Limited arthroscopic debridement, arthroscopic subacromial decompression, and mini-open rotator cuff exploration, right shoulder.;  Surgeon: Corky Mull, MD;  Location: [place];  Service:  Orthopedics;  Laterality: Right;   TONSILLECTOMY     UMBILICAL HERNIA REPAIR N/A 12/25/2020   Procedure: HERNIA REPAIR UMBILICAL ADULT;  Surgeon: Benjamine Sprague, DO;  Location: [facility] ORS;  Service: General;  Laterality: N/A;    Home Medications:  Allergies as of 01/08/2022       Reactions   Ranitidine Hcl Swelling, Cough   Swelling of the throat   Zithromax [azithromycin] Itching, Nausea And Vomiting   Etodolac Nausea And Vomiting   Hctz [hydrochlorothiazide] Cough        Medication List        Accurate as of January 08, 2022  8:58 AM. If you have any questions, ask your nurse or doctor.          amLODipine 5 MG tablet Commonly known as: NORVASC Take 5 mg by mouth in the morning and at bedtime.   DHEA 50 MG Tabs Take 100 mg by mouth daily.   docusate sodium 100 MG capsule Commonly known as: COLACE Take 100 mg by mouth daily.   ezetimibe 10 MG tablet Commonly known as: ZETIA Take 10 mg by mouth daily.   HYDROcodone-acetaminophen 5-325 MG tablet Commonly known as: Norco Take 1 tablet by mouth every 6 (six) hours as needed for up to 6 doses for moderate pain.   naproxen sodium 220 MG tablet Commonly known as: ALEVE Take 220 mg by mouth daily as needed (pain).   Repatha SureClick 751 MG/ML Soaj Generic drug: Evolocumab SMARTSIG:140 Milligram(s) SUB-Q Every 2 Weeks   rosuvastatin 5 MG tablet Commonly known as: CRESTOR Take 5 mg by mouth See admin instructions. Take 1 tablet on Tue, Thu, Sat only.        Allergies:  Allergies  Allergen Reactions   Ranitidine Hcl Swelling and Cough    Swelling of the throat   Zithromax [Azithromycin] Itching and Nausea And Vomiting   Etodolac Nausea And Vomiting   Hctz [Hydrochlorothiazide] Cough    Family History: Family History  Problem Relation Age of Onset   Coronary artery disease Other    Prostate cancer Father    Kidney disease Neg Hx    Kidney cancer Neg Hx    Bladder Cancer Neg Hx     Social History:   reports that he has never smoked. He has never used smokeless tobacco. He reports that he does not drink alcohol and does not use drugs.  Pertinent ROS in HPI  Physical Exam: BP 114/66    Pulse (!) 57    Ht 5' 9"  (1.753 m)    Wt 189 lb (85.7 kg)    BMI 27.91 kg/m  Constitutional:  Well nourished. Alert and oriented, No acute distress. HEENT: [place] AT, mask in place.  Trachea midline Cardiovascular: No clubbing, cyanosis, or edema. Respiratory: Normal respiratory effort, no increased work of breathing. GU: No CVA tenderness.  No bladder fullness or masses.  Patient with circumcised phallus.  Urethral meatus is patent.  No penile discharge. No penile lesions or rashes. Scrotum without lesions, cysts, rashes and/or edema.  Testicles are located scrotally bilaterally. No masses are appreciated in the testicles. Left and right epididymis are normal. Rectal: Patient with  normal sphincter tone. Anus and perineum without scarring or rashes. No rectal masses are appreciated. Prostate is approximately 45 grams, no nodules are appreciated. Seminal vesicles could not be palpated Neurologic: Grossly intact, no focal deficits, moving all 4 extremities. Psychiatric: Normal mood and affect.   Laboratory Data: Component     Latest Ref Rng & Units 01/06/2021 01/04/2022  Prostate Specific Ag, Serum     0.0 - 4.0 ng/mL 0.8 0.9   Uric Acid 4.4 - 7.6 mg/dL 5.4   Resulting Agency  [place] - LAB  Specimen Collected: 11/24/21 11:10 Last Resulted: 11/24/21 16:32  Received From: [place]  Result Received: 12/10/21 12:26   WBC (White Blood Cell Count) 4.1 - 10.2 103/uL 6.9   RBC (Red Blood Cell Count) 4.69 - 6.13 106/uL 5.23   Hemoglobin 14.1 - 18.1 gm/dL 16.2   Hematocrit 40.0 - 52.0 % 45.9   MCV (Mean Corpuscular Volume) 80.0 - 100.0 fl 87.8   MCH (Mean Corpuscular Hemoglobin) 27.0 - 31.2 pg 31.0   MCHC (Mean Corpuscular Hemoglobin Concentration) 32.0 - 36.0 gm/dL 35.3    Platelet Count 150 - 450 103/uL 253   RDW-CV (Red Cell Distribution Width) 11.6 - 14.8 % 13.6   MPV (Mean Platelet Volume) 9.4 - 12.4 fl 9.8   Neutrophils 1.50 - 7.80 103/uL 3.71   Lymphocytes 1.00 - 3.60 103/uL 2.30   Monocytes 0.00 - 1.50 103/uL 0.75   Eosinophils 0.00 - 0.55 103/uL 0.10   Basophils 0.00 - 0.09 103/uL 0.03   Neutrophil % 32.0 - 70.0 % 53.7   Lymphocyte % 10.0 - 50.0 % 33.3   Monocyte % 4.0 - 13.0 % 10.9   Eosinophil % 1.0 - 5.0 % 1.4   Basophil% 0.0 - 2.0 % 0.4   Immature Granulocyte % <=0.7 % 0.3   Immature Granulocyte Count <=0.06 10^3/L 0.02   Resulting Agency  [place] - LAB  Specimen Collected: 11/17/21 09:11 Last Resulted: 11/17/21 10:04  Received From: [place]  Result Received: 12/10/21 12:26   Color Yellow, Violet, Light Violet, Dark Violet Yellow   Clarity Clear, Other Clear   Specific Gravity 1.000 - 1.030 1.020   pH, Urine 5.0 - 8.0 6.0   Protein, Urinalysis Negative, Trace mg/dL Negative   Glucose, Urinalysis Negative mg/dL Negative   Ketones, Urinalysis Negative mg/dL Negative   Blood, Urinalysis Negative Negative   Nitrite, Urinalysis Negative Negative   Leukocyte Esterase, Urinalysis Negative Negative   White Blood Cells, Urinalysis None Seen, 0-3 /hpf None Seen   Red Blood Cells, Urinalysis None Seen, 0-3 /hpf None Seen   Bacteria, Urinalysis None Seen /hpf None Seen   Squamous Epithelial Cells, Urinalysis Rare, Few, None Seen /hpf Rare   Resulting Agency  [place] - LAB  Narrative Performed by [facility] - LAB Trace mucus  Specimen Collected: 11/17/21 09:11 Last Resulted: 11/17/21 15:59  Received From: [facility]  Result Received: 12/10/21 12:26   Cholesterol, Total 100 - 200 mg/dL 196   Triglyceride 35 - 199 mg/dL 210 High    HDL (High Density Lipoprotein) Cholesterol 29.0 - 71.0 mg/dL 45.9   LDL Calculated 0 - 130 mg/dL 108   VLDL Cholesterol mg/dL 42    Cholesterol/HDL Ratio  4.3   Resulting Agency  [place] - LAB  Specimen Collected: 11/17/21 09:11 Last Resulted: 11/17/21 10:59  Received From: [place]  Result Received: 12/10/21 12:26   Glucose 70 - 110 mg/dL 98   Sodium 136 - 145 mmol/L 140   Potassium 3.6 - 5.1 mmol/L 4.4   Chloride 97 - 109 mmol/L 106   Carbon Dioxide (CO2) 22.0 - 32.0 mmol/L 28.7   Urea Nitrogen (BUN) 7 - 25 mg/dL 14   Creatinine 0.7 - 1.3 mg/dL 0.8   Glomerular Filtration Rate (eGFR), MDRD Estimate >60 mL/min/1.73sq m 99   Calcium 8.7 - 10.3 mg/dL 10.3   AST  8 - 39 U/L 23   ALT  6 - 57 U/L 27   Alk Phos (alkaline Phosphatase) 34 - 104 U/L 57   Albumin 3.5 - 4.8 g/dL 4.2   Bilirubin, Total 0.3 - 1.2 mg/dL 0.5   Protein, Total 6.1 - 7.9 g/dL 6.5   A/G Ratio 1.0 - 5.0 gm/dL 1.8   Resulting Agency  [place] - LAB  Specimen Collected: 11/17/21 09:11 Last Resulted: 11/17/21 14:00  Received From: [place]  Result Received: 12/10/21 12:26  I have reviewed the labs.  Pertinent Imaging N/A  Assessment & Plan:    1. Nephrolithiasis -Asymptomatic  2. BPH with LUTS -PSA stable -DRE benign -UA benign -continue conservative management, avoiding bladder irritants and timed voiding's  3. Nocturia -resolved  Return in about 1 year (around 01/08/2023) for IPSS, PSA and exam.  These notes generated with voice recognition software. I apologize for typographical errors.  Zara Council, PA-C  [facility] [address] [place], [place] [address] [phone]

## 2022-01-08 ENCOUNTER — Other Ambulatory Visit: Payer: Self-pay

## 2022-01-08 ENCOUNTER — Encounter: Payer: Self-pay | Admitting: Urology

## 2022-01-08 ENCOUNTER — Ambulatory Visit: Payer: Federal, State, Local not specified - PPO | Admitting: Urology

## 2022-01-08 VITALS — BP 114/66 | HR 57 | Ht 69.0 in | Wt 189.0 lb

## 2022-01-08 DIAGNOSIS — R351 Nocturia: Secondary | ICD-10-CM

## 2022-01-08 DIAGNOSIS — N401 Enlarged prostate with lower urinary tract symptoms: Secondary | ICD-10-CM

## 2022-01-08 DIAGNOSIS — N138 Other obstructive and reflux uropathy: Secondary | ICD-10-CM | POA: Diagnosis not present

## 2022-01-08 DIAGNOSIS — N200 Calculus of kidney: Secondary | ICD-10-CM | POA: Diagnosis not present

## 2022-06-28 ENCOUNTER — Other Ambulatory Visit
Admission: RE | Admit: 2022-06-28 | Discharge: 2022-06-28 | Disposition: A | Discharge status: Home / Self Care | Payer: Federal, State, Local not specified - PPO | Source: Ambulatory Visit | Attending: Pulmonary Disease | Admitting: Pulmonary Disease

## 2022-06-28 DIAGNOSIS — R0602 Shortness of breath: Secondary | ICD-10-CM | POA: Insufficient documentation

## 2022-06-28 DIAGNOSIS — R079 Chest pain, unspecified: Secondary | ICD-10-CM | POA: Insufficient documentation

## 2022-06-28 LAB — D-DIMER, QUANTITATIVE: D-Dimer, Quant: <0.27 ug/mL-FEU (ref 0.00–0.50)

## 2022-08-06 ENCOUNTER — Other Ambulatory Visit: Payer: Self-pay | Admitting: Pulmonary Disease

## 2022-08-06 DIAGNOSIS — R079 Chest pain, unspecified: Secondary | ICD-10-CM

## 2022-08-09 ENCOUNTER — Other Ambulatory Visit: Payer: Self-pay | Admitting: Pulmonary Disease

## 2022-08-09 DIAGNOSIS — R079 Chest pain, unspecified: Secondary | ICD-10-CM

## 2022-08-10 ENCOUNTER — Ambulatory Visit
Admission: RE | Admit: 2022-08-10 | Discharge: 2022-08-10 | Disposition: A | Discharge status: Home / Self Care | Payer: Federal, State, Local not specified - PPO | Source: Ambulatory Visit | Attending: Pulmonary Disease | Admitting: Pulmonary Disease

## 2022-08-10 DIAGNOSIS — R079 Chest pain, unspecified: Secondary | ICD-10-CM

## 2022-08-10 MED ORDER — IOPAMIDOL (ISOVUE-370) INJECTION 76%
75.0000 mL | Freq: Once | INTRAVENOUS | Status: AC | PRN
  Administered 2022-08-10: 75 mL via INTRAVENOUS

## 2023-01-05 ENCOUNTER — Other Ambulatory Visit: Payer: Federal, State, Local not specified - PPO

## 2023-01-05 DIAGNOSIS — N401 Enlarged prostate with lower urinary tract symptoms: Secondary | ICD-10-CM

## 2023-01-06 ENCOUNTER — Other Ambulatory Visit: Payer: Federal, State, Local not specified - PPO

## 2023-01-06 LAB — PSA: Prostate Specific Ag, Serum: 0.9 ng/mL (ref 0.0–4.0)

## 2023-01-10 NOTE — Progress Notes (Unsigned)
01/11/2023 11:08 AM   Anthony Williamson 05-08-1961 [id number]  Referring provider: Idelle Crouch, MD [facility] [place],  [place] [address]  Urological history 1. Nephrolithiasis - stone composition calcium oxalate monohydrate 45%, calcium oxalate dihydrate 35% and calcium phosphate at 5%.   - right ESWL for a 1.3 cm right renal stone on 02/27/2015 - CT Renal stone study on 01/05/2019 revealed nonobstructing right renal stones - metabolic work up 19/4174 extreme hypercalciuria, borderline hyperoxaluria, high urine pH, mild CaOx stone risk and high CaP stone risk - KUB (12/2020) negative -serum creatinine (10/2022) 0.7 -uric acid 5.4 10/2021 -UA (10/2022) pH 6.5  2. BPH with LU TS -PSA (12/2022) 0.9 -I PSS ***  3. Family history of prostate cancer - father with non-lethal prostate cancer  4. ED -contributing factors of age, BPH, HLD, HTN and alcohol consumption -SHIM -sildenafil 20 mg, on-demand-dosing   HPI: Anthony Williamson is a 62 y.o. male who presents today for his yearly visit.       Score:  1-7 Mild 8-19 Moderate 20-35 Severe     Score: 1-7 Severe ED 8-11 Moderate ED 12-16 Mild-Moderate ED 17-21 Mild ED 22-25 No ED   PMH: Past Medical History:  Diagnosis Date   Allergic rhinitis    Duodenitis    Dysuria    Family history of adverse reaction to anesthesia    father - slow to wake   Gastritis    HLD (hyperlipidemia)    Hypercalcemia    Hypertension    Inflammatory arthritis    Low ferritin    Low testosterone    Mechanical back pain    Right humeral fracture    Right kidney stone     Surgical History: Past Surgical History:  Procedure Laterality Date   COLONOSCOPY W/ POLYPECTOMY  02/28/2009   COLONOSCOPY WITH PROPOFOL N/A 12/01/2018   Procedure: COLONOSCOPY WITH PROPOFOL;  Surgeon: Lollie Sails, MD;  Location: [facility] ENDOSCOPY;  Service: Endoscopy;  Laterality: N/A;   ESOPHAGOGASTRODUODENOSCOPY   10/10/1998   Dr. Ara Kussmaul gastritis and duodenitis   FLEXIBLE SIGMOIDOSCOPY  02/16/2002   Dr. Vira Agar   HEMORRHOID SURGERY  04/17/2009   varices/thrombosis surgery Dr. Tamala Julian   NASAL SINUS SURGERY     SHOULDER ARTHROSCOPY Right 04/21/2016   Procedure: Limited arthroscopic debridement, arthroscopic subacromial decompression, and mini-open rotator cuff exploration, right shoulder.;  Surgeon: Corky Mull, MD;  Location: [place] ;  Service: Orthopedics;  Laterality: Right;   TONSILLECTOMY     UMBILICAL HERNIA REPAIR N/A 12/25/2020   Procedure: HERNIA REPAIR UMBILICAL ADULT;  Surgeon: Benjamine Sprague, DO;  Location: [facility] ORS;  Service: General;  Laterality: N/A;    Home Medications:  Allergies as of 01/11/2023       Reactions   Ranitidine Hcl Swelling, Cough   Swelling of the throat   Zithromax [azithromycin] Itching, Nausea And Vomiting   Etodolac Nausea And Vomiting   Hctz [hydrochlorothiazide] Cough        Medication List        Accurate as of January 10, 2023 11:08 AM. If you have any questions, ask your nurse or doctor.          amLODipine 5 MG tablet Commonly known as: NORVASC Take 5 mg by mouth in the morning and at bedtime.   DHEA 50 MG Tabs Take 100 mg by mouth daily.   docusate sodium 100 MG capsule Commonly known as: COLACE Take 100 mg by mouth daily.  ezetimibe 10 MG tablet Commonly known as: ZETIA Take 10 mg by mouth daily.   HYDROcodone-acetaminophen 5-325 MG tablet Commonly known as: Norco Take 1 tablet by mouth every 6 (six) hours as needed for up to 6 doses for moderate pain.   naproxen sodium 220 MG tablet Commonly known as: ALEVE Take 220 mg by mouth daily as needed (pain).   Repatha SureClick 154 MG/ML Soaj Generic drug: Evolocumab SMARTSIG:140 Milligram(s) SUB-Q Every 2 Weeks   rosuvastatin 5 MG tablet Commonly known as: CRESTOR Take 5 mg by mouth See admin instructions. Take 1 tablet on Tue, Thu, Sat only.         Allergies:  Allergies  Allergen Reactions   Ranitidine Hcl Swelling and Cough    Swelling of the throat   Zithromax [Azithromycin] Itching and Nausea And Vomiting   Etodolac Nausea And Vomiting   Hctz [Hydrochlorothiazide] Cough    Family History: Family History  Problem Relation Age of Onset   Coronary artery disease Other    Prostate cancer Father    Kidney disease Neg Hx    Kidney cancer Neg Hx    Bladder Cancer Neg Hx     Social History:  reports that he has never smoked. He has never used smokeless tobacco. He reports that he does not drink alcohol and does not use drugs.  Pertinent ROS in HPI  Physical Exam: There were no vitals taken for this visit.  Constitutional:  Well nourished. Alert and oriented, No acute distress. HEENT: [place] AT, moist mucus membranes.  Trachea midline Cardiovascular: No clubbing, cyanosis, or edema. Respiratory: Normal respiratory effort, no increased work of breathing. GU: No CVA tenderness.  No bladder fullness or masses.  Patient with circumcised/uncircumcised phallus. ***Foreskin easily retracted***  Urethral meatus is patent.  No penile discharge. No penile lesions or rashes. Scrotum without lesions, cysts, rashes and/or edema.  Testicles are located scrotally bilaterally. No masses are appreciated in the testicles. Left and right epididymis are normal. Rectal: Patient with  normal sphincter tone. Anus and perineum without scarring or rashes. No rectal masses are appreciated. Prostate is approximately *** grams, *** nodules are appreciated. Seminal vesicles are normal. Neurologic: Grossly intact, no focal deficits, moving all 4 extremities. Psychiatric: Normal mood and affect.   Laboratory Data: Component     Latest Ref Rng 01/05/2023  Prostate Specific Ag, Serum     0.0 - 4.0 ng/mL 0.9    Cholesterol, Total 100 - 200 mg/dL 156  Triglyceride 35 - 199 mg/dL 121  HDL (High Density Lipoprotein) Cholesterol 29.0 - 71.0 mg/dL 56.2  LDL  Calculated 0 - 130 mg/dL 76  VLDL Cholesterol mg/dL 24  Cholesterol/HDL Ratio  2.8  Resulting Agency  [place] - LAB   Specimen Collected: 11/22/22 09:22   Performed by: [place]: 11/22/22 16:10  Received From: [place]  Result Received: 12/28/22 13:07   WBC (White Blood Cell Count) 4.1 - 10.2 10^3/uL 7.3  RBC (Red Blood Cell Count) 4.69 - 6.13 10^6/uL 5.25  Hemoglobin 14.1 - 18.1 gm/dL 16.1  Hematocrit 40.0 - 52.0 % 47.0  MCV (Mean Corpuscular Volume) 80.0 - 100.0 fl 89.5  MCH (Mean Corpuscular Hemoglobin) 27.0 - 31.2 pg 30.7  MCHC (Mean Corpuscular Hemoglobin Concentration) 32.0 - 36.0 gm/dL 34.3  Platelet Count 150 - 450 10^3/uL 251  RDW-CV (Red Cell Distribution Width) 11.6 - 14.8 % 13.8  MPV (Mean Platelet Volume) 9.4 - 12.4 fl 9.3 Low  Neutrophils 1.50 - 7.80 10^3/uL 4.46  Lymphocytes 1.00 - 3.60 10^3/uL 2.10  Monocytes 0.00 - 1.50 10^3/uL 0.64  Eosinophils 0.00 - 0.55 10^3/uL 0.05  Basophils 0.00 - 0.09 10^3/uL 0.04  Neutrophil % 32.0 - 70.0 % 61.0  Lymphocyte % 10.0 - 50.0 % 28.7  Monocyte % 4.0 - 13.0 % 8.8  Eosinophil % 1.0 - 5.0 % 0.7 Low   Basophil% 0.0 - 2.0 % 0.5  Immature Granulocyte % <=0.7 % 0.3  Immature Granulocyte Count <=0.06 10^3/L 0.02  Resulting Agency  [place] - LAB   Specimen Collected: 11/22/22 09:22   Performed by: [place]: 11/22/22 09:39  Received From: [place]  Result Received: 12/28/22 13:07   Glucose 70 - 110 mg/dL 96  Sodium 136 - 145 mmol/L 141  Potassium 3.6 - 5.1 mmol/L 4.3  Chloride 97 - 109 mmol/L 107  Carbon Dioxide (CO2) 22.0 - 32.0 mmol/L 27.3  Urea Nitrogen (BUN) 7 - 25 mg/dL 11  Creatinine 0.7 - 1.3 mg/dL 0.7  Glomerular Filtration Rate (eGFR) >60 mL/min/1.73sq m 105  Comment: CKD-EPI (2021) does not include patient's race in the calculation of eGFR.  Monitoring changes of plasma creatinine and  eGFR over time is useful for monitoring kidney function.  Interpretive Ranges for eGFR (CKD-EPI 2021):  eGFR:       >60 mL/min/1.73 sq. m - Normal eGFR:       30-59 mL/min/1.73 sq. m - Moderately Decreased eGFR:       15-29 mL/min/1.73 sq. m  - Severely Decreased eGFR:       < 15 mL/min/1.73 sq. m  - Kidney Failure   Note: These eGFR calculations do not apply in acute situations when eGFR is changing rapidly or patients on dialysis.  Calcium 8.7 - 10.3 mg/dL 9.8  AST 8 - 39 U/L 18  ALT 6 - 57 U/L 18  Alk Phos (alkaline Phosphatase) 34 - 104 U/L 60  Albumin 3.5 - 4.8 g/dL 4.3  Bilirubin, Total 0.3 - 1.2 mg/dL 0.5  Protein, Total 6.1 - 7.9 g/dL 6.7  A/G Ratio 1.0 - 5.0 gm/dL 1.8  Resulting Agency  [place] - LAB   Specimen Collected: 11/22/22 09:22   Performed by: [place]: 11/22/22 17:14  Received From: [place]  Result Received: 12/28/22 13:07   Thyroid Stimulating Hormone (TSH) 0.450-5.330 uIU/ml uIU/mL 1.886  Resulting Agency  [place] - LAB   Specimen Collected: 11/22/22 09:22   Performed by: [place]: 11/22/22 16:06  Received From: [place]  Result Received: 12/28/22 13:07    Ref Range & Units 1 mo ago  Color Colorless, Straw, Light Yellow, Yellow, Dark Yellow Light Yellow  Clarity Clear Clear  Specific Gravity 1.005 - 1.030 1.014  pH, Urine 5.0 - 8.0 6.5  Protein, Urinalysis Negative mg/dL Negative  Glucose, Urinalysis Negative mg/dL Negative  Ketones, Urinalysis Negative mg/dL Negative  Blood, Urinalysis Negative Negative  Nitrite, Urinalysis Negative Negative  Leukocyte Esterase, Urinalysis Negative Negative  Bilirubin, Urinalysis Negative Negative  Urobilinogen, Urinalysis 0.2 - 1.0 mg/dL 0.2  WBC, UA <=5 /hpf 1  Red Blood Cells, Urinalysis <=3 /hpf <1  Bacteria, Urinalysis 0 - 5 /hpf 0-5  Squamous Epithelial Cells, Urinalysis /hpf 0   Resulting Agency  [place] - LAB   Specimen Collected: 11/22/22 09:22   Performed by: [place] -  LAB Last Resulted: 11/22/22 10:28  Received From: [place]  Result Received: 12/28/22 13:07  I have reviewed the labs.  Pertinent Imaging N/A  Assessment & Plan:    1. Nephrolithiasis -Asymptomatic  2. BPH with LUTS -PSA stable -DRE benign -UA benign -continue conservative management, avoiding bladder irritants and timed voiding's  3. Nocturia  4. ED *** -resolved  No follow-ups on file.  These notes generated with voice recognition software. I apologize for typographical errors.  [place], [place] [address] [place], [place] [address] [phone]

## 2023-01-11 ENCOUNTER — Encounter: Payer: Self-pay | Admitting: Urology

## 2023-01-11 ENCOUNTER — Ambulatory Visit: Payer: Federal, State, Local not specified - PPO | Admitting: Urology

## 2023-01-11 VITALS — BP 131/75 | HR 67 | Ht 69.0 in | Wt 168.0 lb

## 2023-01-11 DIAGNOSIS — N529 Male erectile dysfunction, unspecified: Secondary | ICD-10-CM

## 2023-01-11 DIAGNOSIS — N401 Enlarged prostate with lower urinary tract symptoms: Secondary | ICD-10-CM

## 2023-01-11 DIAGNOSIS — N138 Other obstructive and reflux uropathy: Secondary | ICD-10-CM | POA: Diagnosis not present

## 2023-01-11 DIAGNOSIS — N200 Calculus of kidney: Secondary | ICD-10-CM

## 2024-01-09 ENCOUNTER — Other Ambulatory Visit: Payer: Federal, State, Local not specified - PPO

## 2024-01-09 ENCOUNTER — Telehealth: Payer: Self-pay | Admitting: Urology

## 2024-01-09 ENCOUNTER — Other Ambulatory Visit: Payer: Self-pay | Admitting: Urology

## 2024-01-09 DIAGNOSIS — N200 Calculus of kidney: Secondary | ICD-10-CM

## 2024-01-09 DIAGNOSIS — N138 Other obstructive and reflux uropathy: Secondary | ICD-10-CM

## 2024-01-09 NOTE — Telephone Encounter (Signed)
 Would you let him know that I titrated x-ray on him prior to his appointment on Thursday?  He has not had an x-ray in 2 years.

## 2024-01-09 NOTE — Progress Notes (Signed)
01/12/2024 10:45 AM   Anthony Williamson 06-24-1961 [id number]  Referring provider: Marguarite Arbour, MD [address] [facility] [place],  [place] [address]  Urological history 1. Nephrolithiasis - stone composition calcium oxalate monohydrate 45%, calcium oxalate dihydrate 35% and calcium phosphate at 5%.   - right ESWL for a 1.3 cm right renal stone on 02/27/2015 - CT Renal stone study on 01/05/2019 revealed nonobstructing right renal stones - metabolic work up 19/1478 extreme hypercalciuria, borderline hyperoxaluria, high urine pH, mild CaOx stone risk and high CaP stone risk - KUB (12/2020) negative -serum creatinine (10/2023) 0.7 -UA (10/2022) pH 6.5  2. BPH with LU TS -PSA (12/2023) 0.7  3. Family history of prostate cancer - father with non-lethal prostate cancer  4. ED -contributing factors of age, BPH, HLD, HTN and alcohol consumption -sildenafil 20 mg, on-demand-dosing   HPI: Anthony Williamson is a 63 y.o. male who presents today for his yearly visit with his wife, Anthony Williamson.   Previous records reviewed.     I PSS 3/0  He has no urinary complaints.  Patient denies any modifying or aggravating factors.  Patient denies any recent UTI's, gross hematuria, dysuria or suprapubic/flank pain.  Patient denies any fevers, chills, nausea or vomiting.      IPSS     Row Name 01/12/24 0900         International Prostate Symptom Score   How often have you had the sensation of not emptying your bladder? Not at All     How often have you had to urinate less than every two hours? Less than 1 in 5 times     How often have you found you stopped and started again several times when you urinated? Not at All     How often have you found it difficult to postpone urination? Not at All     How often have you had a weak urinary stream? Less than 1 in 5 times     How often have you had to strain to start urination? Not at All     How many times did you typically get up at  night to urinate? 1 Time     Total IPSS Score 3       Quality of Life due to urinary symptoms   If you were to spend the rest of your life with your urinary condition just the way it is now how would you feel about that? Delighted                 Score:  1-7 Mild 8-19 Moderate 20-35 Severe   SHIM 16  He can achieve an erection but he cannot maintain erection during intercourse.   Patient still having spontaneous erections.  He denies any pain or curvature with erections.   He has taken Viagra in the past but it gave him a very bad headache.   SHIM     Row Name 01/12/24 0958         SHIM: Over the last 6 months:   How do you rate your confidence that you could get and keep an erection? Moderate     When you had erections with sexual stimulation, how often were your erections hard enough for penetration (entering your partner)? Most Times (much more than half the time)     During sexual intercourse, how often were you able to maintain your erection after you had penetrated (entered) your partner? Sometimes (about half  the time)     During sexual intercourse, how difficult was it to maintain your erection to completion of intercourse? Difficult     When you attempted sexual intercourse, how often was it satisfactory for you? Sometimes (about half the time)       SHIM Total Score   SHIM 16               Score: 1-7 Severe ED 8-11 Moderate ED 12-16 Mild-Moderate ED 17-21 Mild ED 22-25 No ED   PMH: Past Medical History:  Diagnosis Date   Allergic rhinitis    Duodenitis    Dysuria    Family history of adverse reaction to anesthesia    father - slow to wake   Gastritis    HLD (hyperlipidemia)    Hypercalcemia    Hypertension    Inflammatory arthritis    Low ferritin    Low testosterone    Mechanical back pain    Right humeral fracture    Right kidney stone     Surgical History: Past Surgical History:  Procedure Laterality Date   COLONOSCOPY W/  POLYPECTOMY  02/28/2009   COLONOSCOPY WITH PROPOFOL N/A 12/01/2018   Procedure: COLONOSCOPY WITH PROPOFOL;  Surgeon: Anthony Deem, MD;  Location: [facility] END[facility]COPY;  Service: Endoscopy;  Laterality: N/A;   ESOPHAGOGASTRODUODENOSCOPY  10/10/1998   Dr. Elsie Williamson gastritis and duodenitis   FLEXIBLE SIGMOIDOSCOPY  02/16/2002   Dr. Mechele Williamson   HEMORRHOID SURGERY  04/17/2009   varices/thrombosis surgery Dr. Katrinka Williamson   NASAL SINUS SURGERY     SHOULDER ARTHROSCOPY Right 04/21/2016   Procedure: Limited arthroscopic debridement, arthroscopic subacromial decompression, and mini-open rotator cuff exploration, right shoulder.;  Surgeon: Anthony Flake, MD;  Location: [facility];  Service: Orthopedics;  Laterality: Right;   TONSILLECTOMY     UMBILICAL HERNIA REPAIR N/A 12/25/2020   Procedure: HERNIA REPAIR UMBILICAL ADULT;  Surgeon: Anthony Amabile, DO;  Location: [facility] ORS;  Service: General;  Laterality: N/A;    Home Medications:  Allergies as of 01/12/2024       Reactions   Ranitidine Hcl Swelling, Cough   Swelling of the throat   Zithromax [azithromycin] Itching, Nausea And Vomiting   Etodolac Nausea And Vomiting   Hctz [hydrochlorothiazide] Cough        Medication List        Accurate as of January 12, 2024 10:45 AM. If you have any questions, ask your nurse or doctor.          amLODipine 5 MG tablet Commonly known as: NORVASC Take 5 mg by mouth in the morning and at bedtime.   docusate sodium 100 MG capsule Commonly known as: COLACE Take 100 mg by mouth daily.   fluticasone 110 MCG/ACT inhaler Commonly known as: FLOVENT HFA Inhale into the lungs 2 (two) times daily.   HYDROcodone-acetaminophen 5-325 MG tablet Commonly known as: Norco Take 1 tablet by mouth every 6 (six) hours as needed for up to 6 doses for moderate pain.   Mounjaro 5 MG/0.5ML Pen Generic drug: tirzepatide Inject into the skin.   naproxen sodium 220 MG tablet Commonly known as: ALEVE Take 220 mg by  mouth daily as needed (pain).   Repatha SureClick 140 MG/ML Soaj Generic drug: Evolocumab SMARTSIG:140 Milligram(s) SUB-Q Every 2 Weeks   tadalafil 5 MG tablet Commonly known as: CIALIS Take 1 tablet (5 mg total) by mouth daily as needed for erectile dysfunction. Started by: Michiel Cowboy   Trelegy Ellipta 200-62.5-25 MCG/ACT Aepb Generic drug:  Fluticasone-Umeclidin-Vilant Inhale 1 puff into the lungs daily.        Allergies:  Allergies  Allergen Reactions   Ranitidine Hcl Swelling and Cough    Swelling of the throat   Zithromax [Azithromycin] Itching and Nausea And Vomiting   Etodolac Nausea And Vomiting   Hctz [Hydrochlorothiazide] Cough    Family History: Family History  Problem Relation Age of Onset   Coronary artery disease Other    Prostate cancer Father    Kidney disease Neg Hx    Kidney cancer Neg Hx    Bladder Cancer Neg Hx     Social History:  reports that he has never smoked. He has never used smokeless tobacco. He reports that he does not drink alcohol and does not use drugs.  Pertinent ROS in HPI  Physical Exam: BP 122/73   Pulse 61   Ht 5\' 9"  (1.753 m)   Wt 173 lb (78.5 kg)   BMI 25.55 kg/m   Constitutional:  Well nourished. Alert and oriented, No acute distress. HEENT: [place] AT, moist mucus membranes.  Trachea midline, no masses. Cardiovascular: No clubbing, cyanosis, or edema. Respiratory: Normal respiratory effort, no increased work of breathing. GU: No CVA tenderness.  No bladder fullness or masses.  Patient with circumcised phallus.  Urethral meatus is patent.  No penile discharge. No penile lesions or rashes. Scrotum without lesions, cysts, rashes and/or edema.  Testicles are located scrotally bilaterally. No masses are appreciated in the testicles. Left and right epididymis are normal. Rectal: Patient with  normal sphincter tone. Anus and perineum without scarring or rashes. No rectal masses are appreciated. Prostate is approximately 50  grams, no nodules are appreciated. Seminal vesicles could not be palpated.  Neurologic: Grossly intact, no focal deficits, moving all 4 extremities. Psychiatric: Normal mood and affect.   Laboratory Data: Lipid Panel w/calc LDL Order: [id number] Component Ref Range & Units 1 mo ago  Cholesterol, Total 100 - 200 mg/dL 093  Triglyceride 35 - 199 mg/dL 818  HDL (High Density Lipoprotein) Cholesterol 29.0 - 71.0 mg/dL 29.9  LDL Calculated 0 - 130 mg/dL 76  VLDL Cholesterol mg/dL 24  Cholesterol/HDL Ratio 2.8  Resulting Agency [facility]   Specimen Collected: 11/21/23 08:56   Performed by: [facility] Last Resulted: 11/21/23 12:44  Received From: [facility]  Result Received: 01/09/24 09:32    Comprehensive Metabolic Panel (CMP) Order: [id number] Component Ref Range & Units 1 mo ago  Glucose 70 - 110 mg/dL 92  Sodium 381 - 017 mmol/L 139  Potassium 3.6 - 5.1 mmol/L 4.1  Chloride 97 - 109 mmol/L 105  Carbon Dioxide (CO2) 22.0 - 32.0 mmol/L 28.9  Urea Nitrogen (BUN) 7 - 25 mg/dL 17  Creatinine 0.7 - 1.3 mg/dL 0.9  Glomerular Filtration Rate (eGFR) >60 mL/min/1.73sq m 97  Comment: CKD-EPI (2021) does not include patient's race in the calculation of eGFR.  Monitoring changes of plasma creatinine and eGFR over time is useful for monitoring kidney function.  Interpretive Ranges for eGFR (CKD-EPI 2021):  eGFR:       >60 mL/min/1.73 sq. m - Normal eGFR:       30-59 mL/min/1.73 sq. m - Moderately Decreased eGFR:       15-29 mL/min/1.73 sq. m  - Severely Decreased eGFR:       < 15 mL/min/1.73 sq. m  - Kidney Failure   Note: These eGFR calculations do not apply in acute situations when eGFR is  changing rapidly or patients on dialysis.  Calcium 8.7 - 10.3 mg/dL 9.7  AST 8 - 39 U/L 27  ALT 6 - 57 U/L 25  Alk Phos (alkaline Phosphatase) 34 - 104 U/L 56  Albumin 3.5 - 4.8 g/dL 4.4  Bilirubin, Total 0.3 - 1.2 mg/dL 0.6   Protein, Total 6.1 - 7.9 g/dL 6.7  A/G Ratio 1.0 - 5.0 gm/dL 1.9  Resulting Agency [facility]   Specimen Collected: 11/21/23 08:56   Performed by: [facility] Last Resulted: 11/21/23 12:44  Received From: [facility]  Result Received: 01/09/24 09:32    CBC w/auto Differential (5 Part) Order: [id number] Component Ref Range & Units 1 mo ago  WBC (White Blood Cell Count) 4.1 - 10.2 10^3/uL 6.6  RBC (Red Blood Cell Count) 4.69 - 6.13 10^6/uL 5.06  Hemoglobin 14.1 - 18.1 gm/dL 03.4  Hematocrit 74.2 - 52.0 % 45  MCV (Mean Corpuscular Volume) 80.0 - 100.0 fl 88.9  MCH (Mean Corpuscular Hemoglobin) 27.0 - 31.2 pg 31.4 High   MCHC (Mean Corpuscular Hemoglobin Concentration) 32.0 - 36.0 gm/dL 59.5  Platelet Count 638 - 450 10^3/uL 231  RDW-CV (Red Cell Distribution Width) 11.6 - 14.8 % 13.7  MPV (Mean Platelet Volume) 9.4 - 12.4 fl 9.4  Neutrophils 1.50 - 7.80 10^3/uL 3.24  Lymphocytes 1.00 - 3.60 10^3/uL 2.46  Monocytes 0.00 - 1.50 10^3/uL 0.76  Eosinophils 0.00 - 0.55 10^3/uL 0.11  Basophils 0.00 - 0.09 10^3/uL 0.04  Neutrophil % 32.0 - 70.0 % 48.8  Lymphocyte % 10.0 - 50.0 % 37.1  Monocyte % 4.0 - 13.0 % 11.5  Eosinophil % 1.0 - 5.0 % 1.7  Basophil% 0.0 - 2.0 % 0.6  Immature Granulocyte % <=0.7 % 0.3  Immature Granulocyte Count <=0.06 10^3/L 0.02  Resulting Agency [facility]   Specimen Collected: 11/21/23 08:56   Performed by: [facility] Last Resulted: 11/21/23 09:18  Received From: [facility]  Result Received: 01/09/24 09:32    Urinalysis w/Microscopic Order: [id number] Component Ref Range & Units 7 mo ago  Color Colorless, Straw, Light Yellow, Yellow, Dark Yellow Yellow  Clarity Clear Clear  Specific Gravity 1.005 - 1.030 1.016  pH, Urine 5.0 - 8.0 7  Protein, Urinalysis Negative mg/dL Negative  Glucose, Urinalysis Negative mg/dL Negative   Ketones, Urinalysis Negative mg/dL Negative  Blood, Urinalysis Negative Negative  Nitrite, Urinalysis Negative Negative  Leukocyte Esterase, Urinalysis Negative Negative  Bilirubin, Urinalysis Negative Negative  Urobilinogen, Urinalysis 0.2 - 1.0 mg/dL 0.2  WBC, UA <=5 /hpf 0  Red Blood Cells, Urinalysis <=3 /hpf 0  Bacteria, Urinalysis 0 - 5 /hpf 0-5  Squamous Epithelial Cells, Urinalysis /hpf 0  Resulting Agency [facility]   Specimen Collected: 05/20/23 09:34   Performed by: [facility] Last Resulted: 05/20/23 10:40  Received From: [facility]  Result Received: 01/09/24 09:32  I have reviewed the labs.  Pertinent Imaging KUB, right renal stone radiologist rotation still pending I have independently reviewed the films.  See HPI.    Assessment & Plan:    1. Nephrolithiasis -KUB right 3 mm renal stone -Asymptomatic  2. BPH with LUTS -PSA normal  -continue conservative management, avoiding bladder irritants and timed voiding's  3. ED -We discussed his rectal dysfunction issues and decided that we will have a trial of tadalafil 5 mg daily as they would like to be more  spontaneous than scheduled -Tadalafil 5 mg daily sent to the [facility] pharmacy to use single care coupon  Return in about 1 year (around 01/11/2025) for I PSS, SHIM, PSA and exam .  These notes generated with voice recognition software. I apologize for typographical errors.  Cloretta Ned  [facility] [address] [place], [place] [address] [phone]

## 2024-01-10 LAB — PSA: Prostate Specific Ag, Serum: 0.7 ng/mL (ref 0.0–4.0)

## 2024-01-12 ENCOUNTER — Ambulatory Visit
Admission: RE | Admit: 2024-01-12 | Discharge: 2024-01-12 | Disposition: A | Discharge status: Home / Self Care | Payer: Federal, State, Local not specified - PPO | Attending: Urology | Admitting: Urology

## 2024-01-12 ENCOUNTER — Ambulatory Visit
Admission: RE | Admit: 2024-01-12 | Discharge: 2024-01-12 | Disposition: A | Discharge status: Home / Self Care | Payer: Federal, State, Local not specified - PPO | Source: Ambulatory Visit | Attending: Urology | Admitting: Urology

## 2024-01-12 ENCOUNTER — Ambulatory Visit: Payer: Federal, State, Local not specified - PPO | Admitting: Urology

## 2024-01-12 ENCOUNTER — Encounter: Payer: Self-pay | Admitting: Urology

## 2024-01-12 VITALS — BP 122/73 | HR 61 | Ht 69.0 in | Wt 173.0 lb

## 2024-01-12 DIAGNOSIS — N529 Male erectile dysfunction, unspecified: Secondary | ICD-10-CM | POA: Diagnosis not present

## 2024-01-12 DIAGNOSIS — R351 Nocturia: Secondary | ICD-10-CM

## 2024-01-12 DIAGNOSIS — N200 Calculus of kidney: Secondary | ICD-10-CM | POA: Diagnosis present

## 2024-01-12 DIAGNOSIS — N401 Enlarged prostate with lower urinary tract symptoms: Secondary | ICD-10-CM

## 2024-01-12 DIAGNOSIS — N138 Other obstructive and reflux uropathy: Secondary | ICD-10-CM

## 2024-01-12 MED ORDER — TADALAFIL 5 MG PO TABS: 5.0000 mg | ORAL_TABLET | Freq: Every day | ORAL | 3 refills | Status: DC | PRN

## 2024-12-25 ENCOUNTER — Other Ambulatory Visit: Payer: Self-pay

## 2024-12-25 DIAGNOSIS — N138 Other obstructive and reflux uropathy: Secondary | ICD-10-CM

## 2025-01-03 ENCOUNTER — Other Ambulatory Visit: Payer: Self-pay

## 2025-01-03 DIAGNOSIS — N138 Other obstructive and reflux uropathy: Secondary | ICD-10-CM

## 2025-01-04 LAB — PSA: Prostate Specific Ag, Serum: 0.7 ng/mL (ref 0.0–4.0)

## 2025-01-09 NOTE — Progress Notes (Signed)
 "                                DIVISION OF PULMONARY AND CRITICAL CARE MEDICINE                              FOLLOW UP ENCOUNTER     Chief complaint: Mild to moderate persistent asthma with rhinosinusitis  History of Present Illness Anthony Williamson is a 64 year old male with asthma who presents with symptoms of a viral infection and sinus congestion.  He began experiencing symptoms of a viral infection around Christmas, including headache, congestion, muscle aches, and a mild cough, without fever. His symptoms started approximately a week after his wife became ill. Both tested negative for flu and COVID-19 using over-the-counter tests.  He was prescribed prednisone for sinus congestion that developed into a sinus infection. He describes his nasal discharge as bloody and green and feels like there is a persistent sensation in his throat that he cannot clear. He has not used any inhalers during this time for his asthma.  He has a history of asthma and allergies. 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 He underwent voice therapy for glottic insufficiency, dysphonia, and hoarseness. He recalls a significant bleeding episode following a tonsillectomy about twenty years ago, which required surgical intervention to stop the bleeding.  He is currently using prednisone for his sinus congestion and has an albuterol inhaler available for asthma.   Past Medical History:   Past Medical History:  Diagnosis Date   Allergic rhinitis    Gastritis and duodenitis    Hypercalcemia    Hyperlipidemia    Hypertension    Inflammatory arthritis    possible seronegative ([facility]) a. Borderline rheumatoid factor, negative anti-CCP antibodies  b. Trigger nodules   Kidney stones    Low ferritin    Mechanical back pain    Right humeral fracture     Past Surgical History:   Past Surgical History:   Procedure Laterality Date   EGD  10/10/1998   Dr. GORMAN. Bindrim @ [facility] - gastritis, duodenitis   FLEXIBLE SIGMOIDOSCOPY  02/16/2002   Dr. FABIENE Holmes @ [facility] - Int. Hemorrrh.   COLONOSCOPY  02/28/2009   Dr. EMERSON Mariner @ [facility] - benign polyp w/hemorrh. varices/thrombosis - surgery 04/17/2009 Dr. MICAEL Sharps   limited arthroscopic debridement, arthroscopic subacromial decompression, and mini-open rotator cuff exploration, right shoulder Right 04/21/2016   Dr.Poggi   COLONOSCOPY  12/01/2018   Hyperplastic changes/Repeat 75yrs/MUS   UMBILICAL HERNIA REPAIR  12/25/2020   Dr Henriette Pierre   TONSILLECTOMY     UPPER GASTROINTESTINAL ENDOSCOPY      Allergies:   Allergies  Allergen Reactions   Etodolac Other (See Comments)   Hydrochlorothiazide Cough   Zantac [Ranitidine Hcl] Unknown   Zithromax [Azithromycin] Unknown    Current Medications:   Prior to Admission medications  Medication Sig Taking? Last Dose  acetaminophen  (TYLENOL ) 650 MG ER tablet Take 650 mg by mouth 2 (two) times daily Yes Taking  amitriptyline (ELAVIL) 25 MG tablet TAKE 1 TABLET BY MOUTH EVERYDAY AT BEDTIME Yes Taking  amLODIPine (NORVASC) 5 MG tablet TAKE 1 TABLET BY MOUTH TWICE A DAY Yes Taking  cetirizine (ZYRTEC) 10 MG tablet Take 10 mg by mouth once daily Yes Taking  clonazePAM (KLONOPIN) 1 MG tablet Take 1 tablet (1 mg total) by mouth at bedtime as needed for Anxiety Yes Taking  evolocumab (REPATHA SURECLICK) 140 mg/mL PnIj Inject 140 mg subcutaneously every 14 (fourteen) days Yes Taking  HYDROcodone -acetaminophen  (NORCO) 10-325 mg tablet Take 1 tablet by mouth every 6 (six) hours as needed for Pain Yes Taking  oxyCODONE  (ROXICODONE ) 5 MG immediate release tablet  Yes Taking  predniSONE (DELTASONE) 10 MG tablet 40mg  x 2 days, 30mg  x 2days, 20mg  x 2days, 10mg  x 2days Yes Taking  propranoloL (INDERAL) 10 MG tablet Take 1 tablet (10 mg total) by mouth 2 (two) times daily Yes Taking  albuterol MDI, PROVENTIL,  VENTOLIN, PROAIR, HFA 90 mcg/actuation inhaler Inhale 2 inhalations into the lungs every 6 (six) hours as needed for Wheezing for up to 90 days    butalbital-acetaminophen -caffeine (FIORICET) 50-325-40 mg tablet Take 2 tablets by mouth every 4 (four) hours as needed for Headache Patient not taking: Reported on 01/08/2025  Not Taking  ibuprofen (MOTRIN) 600 MG tablet Take 400 mg by mouth 4 (four) times daily Patient not taking: Reported on 01/08/2025  Not Taking  lansoprazole (PREVACID) 15 MG DR capsule Take 15 mg by mouth once daily Patient not taking: Reported on 01/08/2025  Not Taking  naproxen sodium (ALEVE, ANAPROX) 220 MG tablet Take 1 tablet (220 mg total) by mouth as needed Patient not taking: Reported on 01/08/2025  Not Taking  sertraline (ZOLOFT) 25 MG tablet Take 25 mg by mouth once daily Patient not taking: Reported on 01/08/2025  Not Taking  sildenafil (REVATIO) 20 mg tablet Take 1 tablet (20 mg total) by mouth as directed (take 1-3 tablets one hour before intercourse)      Family History:   Family History  Problem Relation Name Age of Onset   Coronary Artery Disease (Blocked arteries around heart) Other     Cancer Mother     Clotting disorder Mother     Dementia Mother     Tremor Mother     High blood pressure (Hypertension) Mother     Cancer Father     Clotting disorder Father     Stroke Father     High blood pressure (Hypertension) Father     Myocardial Infarction (Heart attack) Father     Coronary Artery Disease (Blocked arteries around heart) Father      Social History:   Social History   Socioeconomic History   Marital status: Married  Occupational History   Occupation: retired  Tobacco Use   Smoking status: Never   Smokeless tobacco: Never  Vaping Use   Vaping status: Never Used  Substance and Sexual Activity   Alcohol use: Not Currently    Alcohol/week: 3.0 standard drinks of alcohol    Types: 1 Glasses of wine, 1 Cans of beer, 1 Shots  of liquor per week    Comment: ONCE A MONTH   Drug use: No   Sexual activity: Not Currently  Social History Narrative   Education: Automotive Engineer   Occupation: [employer]   Hobbies: camping   Marital Status: married   Social Drivers of Corporate Investment Banker Strain: Low Risk  (05/29/2024)   Overall Financial Resource Strain (CARDIA)    Difficulty of Paying Living Expenses: Not hard at all  Food Insecurity: No Food Insecurity (05/29/2024)   Hunger Vital Sign    Worried About Running Out of Food in the Last Year: Never true    Ran Out of Food in the  Last Year: Never true  Transportation Needs: No Transportation Needs (05/29/2024)   PRAPARE - Administrator, Civil Service (Medical): No    Lack of Transportation (Non-Medical): No  Housing Stability: Low Risk  (01/08/2025)   Housing Stability Vital Sign    Unable to Pay for Housing in the Last Year: No    Number of Times Moved in the Last Year: 0    Homeless in the Last Year: No    Review of Systems:   A 10 point review of systems is negative, except for the pertinent positives and negatives detailed in the HPI.  Vitals:   Vitals:   01/08/25 1102  BP: 128/82  BP Location: Left upper arm  Patient Position: Sitting  BP Cuff Size: Adult  Pulse: 68  SpO2: 98%  Weight: 80.7 kg (178 lb)  Height: 172.7 cm (5' 8)     Body mass index is 27.06 kg/m.  Physical Exam:   Physical Exam Vitals and nursing note reviewed.  Constitutional:      General: in no acute distress.    Appearance: Normal appearance. Is not ill-appearing, toxic-appearing or diaphoretic.  HENT:     Head: Normocephalic and atraumatic.     Right Ear: External ear normal.     Left Ear: External ear normal.  Eyes:     General:        Right eye: No discharge.        Left eye: No discharge.     Extraocular Movements: Extraocular movements intact.     Pupils: Pupils are equal, round, and reactive to light.  Cardiovascular:     Rate and  Rhythm: Normal rate and regular rhythm.     Pulses: Normal pulses.     Heart sounds: Normal heart sounds. No murmur heard.    No friction rub. No gallop.  Abdominal:     General: Bowel sounds are normal.  Skin:    General: Skin is warm and dry.     Capillary Refill: Capillary refill takes less than 2 seconds.  Neurological:     Mental Status: Patient is alert.     Lab and Imaging Results:   Results Labs Eosinophil count: Intermittently elevated; four episodes of elevation  Radiology Chest X-ray (06/2023): No pulmonary nodules or masses, no consolidation or effusion, no evidence of emphysema  Diagnostic Spirometry (06/2024): FEV1 100% predicted; lung volumes 80% predicted, normal; DLCO mildly reduced, consistent with mild inflammation    Assessment and Plan:   Diagnoses and all orders for this visit:  Moderate persistent asthma with exacerbation (HHS-HCC)  Vocal cord paresis  Other orders -     albuterol MDI, PROVENTIL, VENTOLIN, PROAIR, HFA 90 mcg/actuation inhaler; Inhale 2 inhalations into the lungs every 6 (six) hours as needed for Wheezing for up to 90 days    Assessment & Plan Moderate persistent asthma with recent exacerbation Mild to moderate asthma with allergies, increased susceptibility to viral infections. Recent exacerbation likely due to viral infection, presenting with congestion and sinus infection. Lungs are clear on examination. Previous pulmonary function tests show normal FEV1 and lung volumes, with slight inflammation typical for asthma. Eosinophil levels occasionally elevated, consistent with asthma. Vocal cord scarring noted, possibly related to past tonsillectomy, contributing to hoarseness. Current management with prednisone is effective. - Prescribed albuterol inhaler for as-needed use. - Continue current prednisone regimen. - Provided surgical clearance for upcoming total knee replacement.    I spent 16 minutes in both face-to-face and  non-face-to-face activities  including reviewing history, performing an exam and evaluation, entering clinical information EHR, interpreting results, counseling patient/family/caregiver, reviewing x-rays/CT scans/MRI/labs/echocardiography/PFTs, ordering meds/tests/procedures, referring and communicating with consulting healthcare professionals and care coordination. This does not include time spent with staff.    The patient and/or family voices understanding of the plans. All questions and concerns were answered. The patient and /or family was instructed to call if the patient needs to be seen sooner. Thank you for allowing me to participate in the care of this patient. Do not hesitate to contact me via Epic or by calling our office at [phone] with any questions or concerns.     This note has been created using dictation software tool and any typographical errors are purely unintentional.  Patient received an After Visit Summary     "

## 2025-01-09 NOTE — Progress Notes (Signed)
 "    01/10/2025 11:35 AM   Alm CHRISTELLA Colas 26-Jul-1961 [id number]  Referring provider: Auston Reyes BIRCH, MD [address] [facility] [place],  [place] [address]  Urological history 1. Nephrolithiasis - stone composition calcium oxalate monohydrate 45%, calcium oxalate dihydrate 35% and calcium phosphate at 5%.   - right ESWL for a 1.3 cm right renal stone on 02/27/2015 - CT Renal stone study on 01/05/2019 revealed nonobstructing right renal stones - metabolic work up 87/7980 extreme hypercalciuria, borderline hyperoxaluria, high urine pH, mild CaOx stone risk and high CaP stone risk - KUB (12/2020) negative  2. BPH with LU TS -PSA (12/2024) 0.7  3. Family history of prostate cancer - father with non-lethal prostate cancer  4. ED -contributing factors of age, BPH, HLD, HTN and alcohol consumption -sildenafil 20 mg, on-demand-dosing   HPI: Anthony Williamson is a 64 y.o. male who presents today for his yearly visit with his wife, Marval.   Previous records reviewed.      SHIM     Row Name 01/10/25 1119         SHIM: Over the last 6 months:   How do you rate your confidence that you could get and keep an erection? High     When you had erections with sexual stimulation, how often were your erections hard enough for penetration (entering your partner)? Almost Always or Always     During sexual intercourse, how often were you able to maintain your erection after you had penetrated (entered) your partner? Most Times (much more than half the time)     During sexual intercourse, how difficult was it to maintain your erection to completion of intercourse? Not Difficult     When you attempted sexual intercourse, how often was it satisfactory for you? Most Times (much more than half the time)       SHIM Total Score   SHIM 22      I PSS 3/0  He reports urinary frequency, a weak urinary stream, nocturia x  1.  Patient denies any modifying or aggravating factors.  Patient  denies any recent UTI's, gross hematuria, dysuria or suprapubic/flank pain.  Patient denies any fevers, chills, nausea or vomiting.    He has a family history of PCa with his father.  ,  UA (10/2024) bland   PSA (12/2024) 0.7  Serum creatinine (10/2024) 0.8   SHIM 23  He is having a good response with the tadalafil  5 mg daily.  Patient still having spontaneous erections.  He denies any pain or curvature with erections.   He has no issues with ejaculation.   PMH: Past Medical History:  Diagnosis Date   Allergic rhinitis    Duodenitis    Dysuria    Family history of adverse reaction to anesthesia    father - slow to wake   Gastritis    HLD (hyperlipidemia)    Hypercalcemia    Hypertension    Inflammatory arthritis    Low ferritin    Low testosterone    Mechanical back pain    Right humeral fracture    Right kidney stone     Surgical History: Past Surgical History:  Procedure Laterality Date   COLONOSCOPY W/ POLYPECTOMY  02/28/2009   COLONOSCOPY WITH PROPOFOL  N/A 12/01/2018   Procedure: COLONOSCOPY WITH PROPOFOL ;  Surgeon: Gaylyn Gladis PENNER, MD;  Location: [facility] END[facility];  Service: Endoscopy;  Laterality: N/A;   ESOPHAGOGASTRODUODENOSCOPY  10/10/1998   Dr. Deidre gastritis and duodenitis  FLEXIBLE SIGMOIDOSCOPY  02/16/2002   Dr. Viktoria   HEMORRHOID SURGERY  04/17/2009   varices/thrombosis surgery Dr. Claudene   NASAL SINUS SURGERY     SHOULDER ARTHROSCOPY Right 04/21/2016   Procedure: Limited arthroscopic debridement, arthroscopic subacromial decompression, and mini-open rotator cuff exploration, right shoulder.;  Surgeon: Norleen JINNY Maltos, MD;  Location: [facility];  Service: Orthopedics;  Laterality: Right;   TONSILLECTOMY     UMBILICAL HERNIA REPAIR N/A 12/25/2020   Procedure: HERNIA REPAIR UMBILICAL ADULT;  Surgeon: Tye Millet, DO;  Location: [facility] ORS;  Service: General;  Laterality: N/A;    Home Medications:  Allergies as of 01/10/2025       Reactions    Ranitidine Hcl Swelling, Cough   Swelling of the throat   Zithromax [azithromycin] Itching, Nausea And Vomiting   Etodolac Nausea And Vomiting   Hctz [hydrochlorothiazide] Cough        Medication List        Accurate as of January 10, 2025 11:35 AM. If you have any questions, ask your nurse or doctor.          amitriptyline 25 MG tablet Commonly known as: ELAVIL TAKE 1 TABLET BY MOUTH EVERYDAY AT BEDTIME   amLODipine 5 MG tablet Commonly known as: NORVASC Take 5 mg by mouth in the morning and at bedtime.   amoxicillin 875 MG tablet Commonly known as: AMOXIL SMARTSIG:1 Tablet(s) By Mouth Every 12 Hours   clonazePAM 1 MG tablet Commonly known as: KLONOPIN TAKE 1 TABLET (1 MG TOTAL) BY MOUTH AT BEDTIME AS NEEDED FOR ANXIETY   docusate sodium  100 MG capsule Commonly known as: COLACE Take 100 mg by mouth daily.   fluticasone 110 MCG/ACT inhaler Commonly known as: FLOVENT HFA Inhale into the lungs 2 (two) times daily.   HYDROcodone -acetaminophen  5-325 MG tablet Commonly known as: Norco Take 1 tablet by mouth every 6 (six) hours as needed for up to 6 doses for moderate pain.   montelukast 10 MG tablet Commonly known as: SINGULAIR Take 10 mg by mouth.   Mounjaro 5 MG/0.5ML Pen Generic drug: tirzepatide Inject into the skin.   naproxen sodium 220 MG tablet Commonly known as: ALEVE Take 220 mg by mouth daily as needed (pain).   oxyCODONE  5 MG immediate release tablet Commonly known as: Oxy IR/ROXICODONE  TAKE 1 TABLET EVERY 6 HOURS BY ORAL ROUTE AS NEEDED, FOR PAIN.   predniSONE 10 MG tablet Commonly known as: DELTASONE 40mg  x 2 days, 30mg  x 2days, 20mg  x 2days, 10mg  x 2days   propranolol 10 MG tablet Commonly known as: INDERAL Take 10 mg by mouth 2 (two) times daily.   Repatha SureClick 140 MG/ML Soaj Generic drug: Evolocumab SMARTSIG:140 Milligram(s) SUB-Q Every 2 Weeks   sertraline 25 MG tablet Commonly known as: ZOLOFT Take 25 mg by mouth.    tadalafil  5 MG tablet Commonly known as: CIALIS  Take 1 tablet (5 mg total) by mouth daily as needed for erectile dysfunction.   Trelegy Ellipta 200-62.5-25 MCG/ACT Aepb Generic drug: Fluticasone-Umeclidin-Vilant Inhale 1 puff into the lungs daily.        Allergies:  Allergies  Allergen Reactions   Ranitidine Hcl Swelling and Cough    Swelling of the throat   Zithromax [Azithromycin] Itching and Nausea And Vomiting   Etodolac Nausea And Vomiting   Hctz [Hydrochlorothiazide] Cough    Family History: Family History  Problem Relation Age of Onset   Coronary artery disease Other    Prostate cancer Father    Kidney  disease Neg Hx    Kidney cancer Neg Hx    Bladder Cancer Neg Hx     Social History:  reports that he has never smoked. He has never used smokeless tobacco. He reports that he does not drink alcohol and does not use drugs.  Pertinent ROS in HPI  Physical Exam: BP 110/72   Pulse 69   Wt 178 lb (80.7 kg)   SpO2 96%   BMI 26.29 kg/m   Constitutional:  Well nourished. Alert and oriented, No acute distress. HEENT: [place] AT, moist mucus membranes.  Trachea midline Cardiovascular: No clubbing, cyanosis, or edema. Respiratory: Normal respiratory effort, no increased work of breathing. GU: No CVA tenderness.  No bladder fullness or masses.  Patient with circumcised phallus.   Urethral meatus is patent.  No penile discharge. No penile lesions or rashes. Scrotum without lesions, cysts, rashes and/or edema.  Testicles are located scrotally bilaterally. No masses are appreciated in the testicles. Left and right epididymis are normal. Rectal: Patient with  normal sphincter tone. Anus and perineum without scarring or rashes. No rectal masses are appreciated. Prostate is approximately 40 grams, no nodules are appreciated. Seminal vesicles could not be palpated.  Neurologic: Grossly intact, no focal deficits, moving all 4 extremities. Psychiatric: Normal mood and affect.    Laboratory Data: See Epic and HPI   I have reviewed the labs.  Pertinent Imaging N/A  Assessment & Plan:    1. Nephrolithiasis -KUB right 3 mm renal stone -Asymptomatic  2. BPH with LUTS -PSA normal  -continue conservative management, avoiding bladder irritants and timed voiding's  3. ED - good response with tadalafil  5 mg daily; refills sent to pharmacy   Return in about 1 year (around 01/10/2026) for PSA, I PSS, SHIM .  These notes generated with voice recognition software. I apologize for typographical errors.  CLOTILDA HELON RIGGERS  [facility] [address] [place], [place] [address] [phone]  "

## 2025-01-10 ENCOUNTER — Encounter: Payer: Self-pay | Admitting: Urology

## 2025-01-10 ENCOUNTER — Ambulatory Visit: Admitting: Urology

## 2025-01-10 ENCOUNTER — Ambulatory Visit: Payer: Self-pay | Admitting: Urology

## 2025-01-10 VITALS — BP 110/72 | HR 69 | Wt 178.0 lb

## 2025-01-10 DIAGNOSIS — N200 Calculus of kidney: Secondary | ICD-10-CM

## 2025-01-10 DIAGNOSIS — N138 Other obstructive and reflux uropathy: Secondary | ICD-10-CM

## 2025-01-10 DIAGNOSIS — N529 Male erectile dysfunction, unspecified: Secondary | ICD-10-CM | POA: Diagnosis not present

## 2025-01-10 DIAGNOSIS — N401 Enlarged prostate with lower urinary tract symptoms: Secondary | ICD-10-CM

## 2025-01-10 MED ORDER — TADALAFIL 5 MG PO TABS: 5.0000 mg | ORAL_TABLET | Freq: Every day | ORAL | 3 refills | Status: AC | PRN

## 2026-01-02 ENCOUNTER — Other Ambulatory Visit

## 2026-01-10 ENCOUNTER — Ambulatory Visit: Admitting: Urology
